# Patient Record
Sex: Female | Born: 1990 | Race: White | Hispanic: No | Marital: Married | Smoking: Never smoker
Health system: Southern US, Community
[De-identification: ages and names within clinical notes are randomized; demographics above are authoritative.]

## PROBLEM LIST (undated history)

## (undated) DIAGNOSIS — F419 Anxiety disorder, unspecified: Secondary | ICD-10-CM

## (undated) DIAGNOSIS — F32A Depression, unspecified: Secondary | ICD-10-CM

## (undated) DIAGNOSIS — O99019 Anemia complicating pregnancy, unspecified trimester: Secondary | ICD-10-CM

## (undated) HISTORY — DX: Anemia complicating pregnancy, unspecified trimester: O99.019

## (undated) HISTORY — DX: Depression, unspecified: F32.A

## (undated) HISTORY — DX: Anxiety disorder, unspecified: F41.9

---
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
## 2012-04-01 HISTORY — PX: FOOT SURGERY: SHX648

## 2013-04-01 HISTORY — PX: BREAST MASS EXCISION: SHX1267

## 2018-09-03 LAB — HM PAP SMEAR: HM Pap smear: NEGATIVE

## 2020-04-01 NOTE — L&D Delivery Note (Signed)
       Delivery Note   Deborah Lester is a 30 y.o. G2P1001 at [redacted]w[redacted]d Estimated Date of Delivery: 02/03/21  PRE-OPERATIVE DIAGNOSIS:  1) [redacted]w[redacted]d pregnancy.  2) SROM - Labor  POST-OPERATIVE DIAGNOSIS:  1) [redacted]w[redacted]d pregnancy s/p Vaginal, Spontaneous  2) Viable female  Delivery Type: Vaginal, Spontaneous    Delivery Anesthesia: Epidural   Labor Complications:      ESTIMATED BLOOD LOSS: 25  ml    FINDINGS:   1) female infant, Apgar scores of 9   at 1 minute and 9   at 5 minutes and a birthweight of   ounces.    2) Nuchal cord: Yes  SPECIMENS:   PLACENTA:   Appearance: Intact    Removal: Spontaneous      Disposition:    DISPOSITION:  Infant to left in stable condition in the delivery room, with L&D personnel and mother,  NARRATIVE SUMMARY: Labor course:  [name] is a G2P1001 at [redacted]w[redacted]d who presented for labor management.  She progressed well in labor without pitocin.  She received the appropriate anesthesia and proceeded to complete dilation. She evidenced good maternal expulsive effort during the second stage. She went on to deliver a viable infant. The placenta delivered without problems and was noted to be complete. A perineal and vaginal examination was performed. Episiotomy/Lacerations: None    Elonda Husky, M.D. 01/21/2021 11:34 AM

## 2020-06-06 ENCOUNTER — Ambulatory Visit (INDEPENDENT_AMBULATORY_CARE_PROVIDER_SITE_OTHER): Payer: BC Managed Care – PPO | Admitting: Certified Nurse Midwife

## 2020-06-06 ENCOUNTER — Encounter: Payer: Self-pay | Admitting: Certified Nurse Midwife

## 2020-06-06 ENCOUNTER — Other Ambulatory Visit: Payer: Self-pay

## 2020-06-06 VITALS — BP 116/76 | HR 69 | Resp 16 | Ht 67.0 in | Wt 136.0 lb

## 2020-06-06 DIAGNOSIS — N912 Amenorrhea, unspecified: Secondary | ICD-10-CM

## 2020-06-06 LAB — POCT URINE PREGNANCY: Preg Test, Ur: POSITIVE — AB

## 2020-06-06 MED ORDER — DOXYLAMINE-PYRIDOXINE 10-10 MG PO TBEC: 1.0000 | DELAYED_RELEASE_TABLET | Freq: Four times a day (QID) | ORAL | 5 refills | Status: DC

## 2020-06-06 NOTE — Progress Notes (Signed)
Subjective:    Shenaya Lebo is a 30 y.o. female who presents for evaluation of amenorrhea. She believes she could be pregnant. Pregnancy is desired. Sexual Activity: single partner, contraception: none. Current symptoms also include: fatigue and nausea. Last period was abnormal.   No LMP recorded. The following portions of the patient's history were reviewed and updated as appropriate: allergies, current medications, past family history, past medical history, past social history, past surgical history and problem list.  Review of Systems Pertinent items are noted in HPI.     Objective:    There were no vitals taken for this visit. General: alert, cooperative, appears stated age and no acute distress    Lab Review Urine HCG: positive    Assessment:    Absence of menstruation.     Plan:     Positive: EDC: unsure of last LMP ( dad passed in January, she can not remember if she had a period in Jan).. Briefly discussed pre-natal care options. MD or Midwifery care discussed. Encouraged well-balanced diet, plenty of rest when needed, pre-natal vitamins daily and walking for exercise. Discussed self-help for nausea, avoiding OTC medications until consulting provider or pharmacist, other than Tylenol as needed, minimal caffeine (1-2 cups daily) and avoiding alcohol. She will schedule her u/s for dating in 1-2 wks, her nurse visit @ [redacted] wks pregnant and her  initial OB visit @ 12 wks. Having nausea diclegis ordered. . Feel free to call with any questions.    Doreene Burke, CNM

## 2020-06-06 NOTE — Patient Instructions (Signed)
https://www.acog.org/womens-health/faqs/prenatal-genetic-screening-tests">  Prenatal Care Prenatal care is health care during pregnancy. It helps you and your unborn baby (fetus) stay as healthy as possible. Prenatal care may be provided by a midwife, a family practice doctor, a mid-level practitioner (nurse practitioner or physician assistant), or a childbirth and pregnancy doctor (obstetrician). How does this affect me? During pregnancy, you will be closely monitored for any new conditions that might develop. To lower your risk of pregnancy complications, you and your health care provider will talk about any underlying conditions you have. How does this affect my baby? Early and consistent prenatal care increases the chance that your baby will be healthy during pregnancy. Prenatal care lowers the risk that your baby will be:  Born early (prematurely).  Smaller than expected at birth (small for gestational age). What can I expect at the first prenatal care visit? Your first prenatal care visit will likely be the longest. You should schedule your first prenatal care visit as soon as you know that you are pregnant. Your first visit is a good time to talk about any questions or concerns you have about pregnancy. Medical history At your visit, you and your health care provider will talk about your medical history, including:  Any past pregnancies.  Your family's medical history.  Medical history of the baby's father.  Any long-term (chronic) health conditions you have and how you manage them.  Any surgeries or procedures you have had.  Any current over-the-counter or prescription medicines, herbs, or supplements that you are taking.  Other factors that could pose a risk to your baby, including: ? Exposure to harmful chemicals or radiation at work or at home. ? Any substance use, including tobacco, alcohol, and drug use.  Your home setting and your stress levels, including: ? Exposure to  abuse or violence. ? Household financial strain.  Your daily health habits, including diet and exercise. Tests and screenings Your health care provider will:  Measure your weight, height, and blood pressure.  Do a physical exam, including a pelvic and breast exam.  Perform blood tests and urine tests to check for: ? Urinary tract infection. ? Sexually transmitted infections (STIs). ? Low iron levels in your blood (anemia). ? Blood type and certain proteins on red blood cells (Rh antibodies). ? Infections and immunity to viruses, such as hepatitis B and rubella. ? HIV (human immunodeficiency virus).  Discuss your options for genetic screening. Tips about staying healthy Your health care provider will also give you information about how to keep yourself and your baby healthy, including:  Nutrition and taking vitamins.  Physical activity.  How to manage pregnancy symptoms such as nausea and vomiting (morning sickness).  Infections and substances that may be harmful to your baby and how to avoid them.  Food safety.  Dental care.  Working.  Travel.  Warning signs to watch for and when to call your health care provider. How often will I have prenatal care visits? After your first prenatal care visit, you will have regular visits throughout your pregnancy. The visit schedule is often as follows:  Up to week 28 of pregnancy: once every 4 weeks.  28-36 weeks: once every 2 weeks.  After 36 weeks: every week until delivery. Some women may have visits more or less often depending on any underlying health conditions and the health of the baby. Keep all follow-up and prenatal care visits. This is important. What happens during routine prenatal care visits? Your health care provider will:  Measure your weight   and blood pressure.  Check for fetal heart sounds.  Measure the height of your uterus in your abdomen (fundal height). This may be measured starting around week 20 of  pregnancy.  Check the position of your baby inside your uterus.  Ask questions about your diet, sleeping patterns, and whether you can feel the baby move.  Review warning signs to watch for and signs of labor.  Ask about any pregnancy symptoms you are having and how you are dealing with them. Symptoms may include: ? Headaches. ? Nausea and vomiting. ? Vaginal discharge. ? Swelling. ? Fatigue. ? Constipation. ? Changes in your vision. ? Feeling persistently sad or anxious. ? Any discomfort, including back or pelvic pain. ? Bleeding or spotting. Make a list of questions to ask your health care provider at your routine visits.   What tests might I have during prenatal care visits? You may have blood, urine, and imaging tests throughout your pregnancy, such as:  Urine tests to check for glucose, protein, or signs of infection.  Glucose tests to check for a form of diabetes that can develop during pregnancy (gestational diabetes mellitus). This is usually done around week 24 of pregnancy.  Ultrasounds to check your baby's growth and development, to check for birth defects, and to check your baby's well-being. These can also help to decide when you should deliver your baby.  A test to check for group B strep (GBS) infection. This is usually done around week 36 of pregnancy.  Genetic testing. This may include blood, fluid, or tissue sampling, or imaging tests, such as an ultrasound. Some genetic tests are done during the first trimester and some are done during the second trimester. What else can I expect during prenatal care visits? Your health care provider may recommend getting certain vaccines during pregnancy. These may include:  A yearly flu shot (annual influenza vaccine). This is especially important if you will be pregnant during flu season.  Tdap (tetanus, diphtheria, pertussis) vaccine. Getting this vaccine during pregnancy can protect your baby from whooping cough  (pertussis) after birth. This vaccine may be recommended between weeks 27 and 36 of pregnancy.  A COVID-19 vaccine. Later in your pregnancy, your health care provider may give you information about:  Childbirth and breastfeeding classes.  Choosing a health care provider for your baby.  Umbilical cord banking.  Breastfeeding.  Birth control after your baby is born.  The hospital labor and delivery unit and how to set up a tour.  Registering at the hospital before you go into labor. Where to find more information  Office on Women's Health: womenshealth.gov  American Pregnancy Association: americanpregnancy.org  March of Dimes: marchofdimes.org Summary  Prenatal care helps you and your baby stay as healthy as possible during pregnancy.  Your first prenatal care visit will most likely be the longest.  You will have visits and tests throughout your pregnancy to monitor your health and your baby's health.  Bring a list of questions to your visits to ask your health care provider.  Make sure to keep all follow-up and prenatal care visits. This information is not intended to replace advice given to you by your health care provider. Make sure you discuss any questions you have with your health care provider. Document Revised: 12/30/2019 Document Reviewed: 12/30/2019 Elsevier Patient Education  2021 Elsevier Inc.  

## 2020-06-19 ENCOUNTER — Other Ambulatory Visit: Payer: Self-pay | Admitting: Certified Nurse Midwife

## 2020-06-19 ENCOUNTER — Ambulatory Visit
Admission: RE | Admit: 2020-06-19 | Discharge: 2020-06-19 | Disposition: A | Discharge status: Home / Self Care | Payer: BC Managed Care – PPO | Source: Ambulatory Visit | Attending: Certified Nurse Midwife | Admitting: Certified Nurse Midwife

## 2020-06-19 ENCOUNTER — Other Ambulatory Visit: Payer: Self-pay

## 2020-06-19 DIAGNOSIS — N912 Amenorrhea, unspecified: Secondary | ICD-10-CM

## 2020-06-19 DIAGNOSIS — Z3491 Encounter for supervision of normal pregnancy, unspecified, first trimester: Secondary | ICD-10-CM

## 2020-07-07 ENCOUNTER — Other Ambulatory Visit: Payer: Self-pay

## 2020-07-07 ENCOUNTER — Ambulatory Visit (INDEPENDENT_AMBULATORY_CARE_PROVIDER_SITE_OTHER): Payer: BC Managed Care – PPO | Admitting: Surgical

## 2020-07-07 VITALS — BP 109/72 | HR 67 | Ht 67.0 in | Wt 132.4 lb

## 2020-07-07 DIAGNOSIS — Z0283 Encounter for blood-alcohol and blood-drug test: Secondary | ICD-10-CM | POA: Diagnosis not present

## 2020-07-07 DIAGNOSIS — Z3491 Encounter for supervision of normal pregnancy, unspecified, first trimester: Secondary | ICD-10-CM | POA: Diagnosis not present

## 2020-07-07 NOTE — Progress Notes (Signed)
Deborah Lester presents for NOB nurse interview visit. Pregnancy confirmation done 06/06/2020. G2 P1001. Pregnancy education material explained and given. 0 cats in home. NOB labs ordered.  HIV labs and drug screen were explained and ordered. PNV encouraged. Genetic screening options discussed. Genetic testing: Declined. Patient may discuss with the provider. Patient to follow up with provider on 07/19/2020 for NOB physical. All questions answered. Drug screen and FMLA form signed.

## 2020-07-07 NOTE — Patient Instructions (Signed)
[email or web address]">  First Trimester of Pregnancy  The first trimester of pregnancy starts on the first day of your last menstrual period until the end of week 12. This is also called months 1 through 3 of pregnancy. Body changes during your first trimester Your body goes through many changes during pregnancy. The changes usually return to normal after your baby is born. Physical changes  You may gain or lose weight.  Your breasts may grow larger and hurt. The area around your nipples may get darker.  Dark spots or blotches may develop on your face.  You may have changes in your hair. Health changes  You may feel like you might vomit (nauseous), and you may vomit.  You may have heartburn.  You may have headaches.  You may have trouble pooping (constipation).  Your gums may bleed. Other changes  You may get tired easily.  You may pee (urinate) more often.  Your menstrual periods will stop.  You may not feel hungry.  You may want to eat certain kinds of food.  You may have changes in your emotions from day to day.  You may have more dreams. Follow these instructions at home: Medicines  Take over-the-counter and prescription medicines only as told by your doctor. Some medicines are not safe during pregnancy.  Take a prenatal vitamin that contains at least 600 micrograms (mcg) of folic acid. Eating and drinking  Eat healthy meals that include: ? Fresh fruits and vegetables. ? Whole grains. ? Good sources of protein, such as meat, eggs, or tofu. ? Low-fat dairy products.  Avoid raw meat and unpasteurized juice, milk, and cheese.  If you feel like you may vomit, or you vomit: ? Eat 4 or 5 small meals a day instead of 3 large meals. ? Try eating a few soda crackers. ? Drink liquids between meals instead of during meals.  You may need to take these actions to prevent or treat trouble pooping: ? Drink enough fluids to keep your pee  (urine) pale yellow. ? Eat foods that are high in fiber. These include beans, whole grains, and fresh fruits and vegetables. ? Limit foods that are high in fat and sugar. These include fried or sweet foods. Activity  Exercise only as told by your doctor. Most people can do their usual exercise routine during pregnancy.  Stop exercising if you have cramps or pain in your lower belly (abdomen) or low back.  Do not exercise if it is too hot or too humid, or if you are in a place of great height (high altitude).  Avoid heavy lifting.  If you choose to, you may have sex unless your doctor tells you not to. Relieving pain and discomfort  Wear a good support bra if your breasts are sore.  Rest with your legs raised (elevated) if you have leg cramps or low back pain.  If you have bulging veins (varicose veins) in your legs: ? Wear support hose as told by your doctor. ? Raise your feet for 15 minutes, 3-4 times a day. ? Limit salt in your food. Safety  Wear your seat belt at all times when you are in a car.  Talk with your doctor if someone is hurting you or yelling at you.  Talk with your doctor if you are feeling sad or have thoughts of hurting yourself. Lifestyle  Do not use hot tubs, steam rooms, or saunas.  Do not douche. Do not use tampons or scented sanitary pads.  Do not  use herbal medicines, illegal drugs, or medicines that are not approved by your doctor. Do not drink alcohol.  Do not smoke or use any products that contain nicotine or tobacco. If you need help quitting, ask your doctor.  Avoid cat litter boxes and soil that is used by cats. These carry germs that can cause harm to the baby and can cause a loss of your baby by miscarriage or stillbirth. General instructions  Keep all follow-up visits. This is important.  Ask for help if you need counseling or if you need help with nutrition. Your doctor can give you advice or tell you where to go for help.  Visit your  dentist. At home, brush your teeth with a soft toothbrush. Floss gently.  Write down your questions. Take them to your prenatal visits. Where to find more information  American Pregnancy Association: americanpregnancy.org  American College of Obstetricians and Gynecologists: www.acog.org  Office on Women's Health: womenshealth.gov/pregnancy Contact a doctor if:  You are dizzy.  You have a fever.  You have mild cramps or pressure in your lower belly.  You have a nagging pain in your belly area.  You continue to feel like you may vomit, you vomit, or you have watery poop (diarrhea) for 24 hours or longer.  You have a bad-smelling fluid coming from your vagina.  You have pain when you pee.  You are exposed to a disease that spreads from person to person, such as chickenpox, measles, Zika virus, HIV, or hepatitis. Get help right away if:  You have spotting or bleeding from your vagina.  You have very bad belly cramping or pain.  You have shortness of breath or chest pain.  You have any kind of injury, such as from a fall or a car crash.  You have new or increased pain, swelling, or redness in an arm or leg. Summary  The first trimester of pregnancy starts on the first day of your last menstrual period until the end of week 12 (months 1 through 3).  Eat 4 or 5 small meals a day instead of 3 large meals.  Do not smoke or use any products that contain nicotine or tobacco. If you need help quitting, ask your doctor.  Keep all follow-up visits. This information is not intended to replace advice given to you by your health care provider. Make sure you discuss any questions you have with your health care provider. Document Revised: 08/25/2019 Document Reviewed: 07/01/2019 Elsevier Patient Education  2021 Elsevier Inc.    Common Medications Safe in Pregnancy  Acne:      Constipation:  Benzoyl Peroxide     Colace  Clindamycin      Dulcolax Suppository  Topica  Erythromycin     Fibercon  Salicylic Acid      Metamucil         Miralax AVOID:        Senakot   Accutane    Cough:  Retin-A       Cough Drops  Tetracycline      Phenergan w/ Codeine if Rx  Minocycline      Robitussin (Plain & DM)  Antibiotics:     Crabs/Lice:  Ceclor       RID  Cephalosporins    AVOID:  E-Mycins      Kwell  Keflex  Macrobid/Macrodantin   Diarrhea:  Penicillin      Kao-Pectate  Zithromax      Imodium AD           PUSH FLUIDS AVOID:       Cipro     Fever:  Tetracycline      Tylenol (Regular or Extra  Minocycline       Strength)  Levaquin      Extra Strength-Do not          Exceed 8 tabs/24 hrs Caffeine:        <200mg/day (equiv. To 1 cup of coffee or  approx. 3 12 oz sodas)         Gas: Cold/Hayfever:       Gas-X  Benadryl      Mylicon  Claritin       Phazyme  **Claritin-D        Chlor-Trimeton    Headaches:  Dimetapp      ASA-Free Excedrin  Drixoral-Non-Drowsy     Cold Compress  Mucinex (Guaifenasin)     Tylenol (Regular or Extra  Sudafed/Sudafed-12 Hour     Strength)  **Sudafed PE Pseudoephedrine   Tylenol Cold & Sinus     Vicks Vapor Rub  Zyrtec  **AVOID if Problems With Blood Pressure         Heartburn: Avoid lying down for at least 1 hour after meals  Aciphex      Maalox     Rash:  Milk of Magnesia     Benadryl    Mylanta       1% Hydrocortisone Cream  Pepcid  Pepcid Complete   Sleep Aids:  Prevacid      Ambien   Prilosec       Benadryl  Rolaids       Chamomile Tea  Tums (Limit 4/day)     Unisom         Tylenol PM         Warm milk-add vanilla or  Hemorrhoids:       Sugar for taste  Anusol/Anusol H.C.  (RX: Analapram 2.5%)  Sugar Substitutes:  Hydrocortisone OTC     Ok in moderation  Preparation H      Tucks        Vaseline lotion applied to tissue with wiping    Herpes:     Throat:  Acyclovir      Oragel  Famvir  Valtrex     Vaccines:         Flu Shot Leg Cramps:       *Gardasil  Benadryl      Hepatitis  A         Hepatitis B Nasal Spray:       Pneumovax  Saline Nasal Spray     Polio Booster         Tetanus Nausea:       Tuberculosis test or PPD  Vitamin B6 25 mg TID   AVOID:    Dramamine      *Gardasil  Emetrol       Live Poliovirus  Ginger Root 250 mg QID    MMR (measles, mumps &  High Complex Carbs @ Bedtime    rebella)  Sea Bands-Accupressure    Varicella (Chickenpox)  Unisom 1/2 tab TID     *No known complications           If received before Pain:         Known pregnancy;   Darvocet       Resume series after  Lortab        Delivery  Percocet    Yeast:   Tramadol        Femstat  Tylenol 3      Gyne-lotrimin  Ultram       Monistat  Vicodin           MISC:         All Sunscreens           Hair Coloring/highlights          Insect Repellant's          (Including DEET)         Mystic Tans  

## 2020-07-08 LAB — URINALYSIS, ROUTINE W REFLEX MICROSCOPIC
Bilirubin, UA: NEGATIVE
Glucose, UA: NEGATIVE
Leukocytes,UA: NEGATIVE
Nitrite, UA: NEGATIVE
Protein,UA: NEGATIVE
RBC, UA: NEGATIVE
Specific Gravity, UA: 1.023 (ref 1.005–1.030)
Urobilinogen, Ur: 0.2 mg/dL (ref 0.2–1.0)
pH, UA: 5.5 (ref 5.0–7.5)

## 2020-07-08 LAB — VIRAL HEPATITIS HBV, HCV
HCV Ab: <0.1 s/co ratio (ref 0.0–0.9)
Hep B Core Total Ab: NEGATIVE
Hep B Surface Ab, Qual: NONREACTIVE
Hepatitis B Surface Ag: NEGATIVE

## 2020-07-08 LAB — ABO AND RH: Rh Factor: POSITIVE

## 2020-07-08 LAB — HCV INTERPRETATION

## 2020-07-08 LAB — HIV ANTIBODY (ROUTINE TESTING W REFLEX): HIV Screen 4th Generation wRfx: NONREACTIVE

## 2020-07-08 LAB — ANTIBODY SCREEN: Antibody Screen: NEGATIVE

## 2020-07-08 LAB — RUBELLA SCREEN: Rubella Antibodies, IGG: 2.48 index (ref >0.99)

## 2020-07-08 LAB — VARICELLA ZOSTER ANTIBODY, IGG: Varicella zoster IgG: 655 index (ref >165)

## 2020-07-08 LAB — RPR: RPR Ser Ql: NONREACTIVE

## 2020-07-09 LAB — CULTURE, OB URINE

## 2020-07-09 LAB — URINE CULTURE, OB REFLEX

## 2020-07-09 LAB — GC/CHLAMYDIA PROBE AMP
Chlamydia trachomatis, NAA: NEGATIVE
Neisseria Gonorrhoeae by PCR: NEGATIVE

## 2020-07-10 LAB — MONITOR DRUG PROFILE 14(MW)
Amphetamine Scrn, Ur: NEGATIVE ng/mL
BARBITURATE SCREEN URINE: NEGATIVE ng/mL
BENZODIAZEPINE SCREEN, URINE: NEGATIVE ng/mL
Buprenorphine, Urine: NEGATIVE ng/mL
CANNABINOIDS UR QL SCN: NEGATIVE ng/mL
Cocaine (Metab) Scrn, Ur: NEGATIVE ng/mL
Creatinine(Crt), U: 156.6 mg/dL (ref 20.0–300.0)
Fentanyl, Urine: NEGATIVE pg/mL
Meperidine Screen, Urine: NEGATIVE ng/mL
Methadone Screen, Urine: NEGATIVE ng/mL
OXYCODONE+OXYMORPHONE UR QL SCN: NEGATIVE ng/mL
Opiate Scrn, Ur: NEGATIVE ng/mL
Ph of Urine: 5.3 (ref 4.5–8.9)
Phencyclidine Qn, Ur: NEGATIVE ng/mL
Propoxyphene Scrn, Ur: NEGATIVE ng/mL
SPECIFIC GRAVITY: 1.029
Tramadol Screen, Urine: NEGATIVE ng/mL

## 2020-07-19 ENCOUNTER — Encounter: Payer: Self-pay | Admitting: Certified Nurse Midwife

## 2020-07-19 ENCOUNTER — Ambulatory Visit: Payer: BC Managed Care – PPO | Admitting: Certified Nurse Midwife

## 2020-07-19 ENCOUNTER — Other Ambulatory Visit: Payer: Self-pay

## 2020-07-19 VITALS — BP 115/74 | HR 81 | Wt 132.2 lb

## 2020-07-19 DIAGNOSIS — Z3491 Encounter for supervision of normal pregnancy, unspecified, first trimester: Secondary | ICD-10-CM

## 2020-07-19 DIAGNOSIS — Z3A12 12 weeks gestation of pregnancy: Secondary | ICD-10-CM

## 2020-07-19 LAB — POCT URINALYSIS DIPSTICK OB
Bilirubin, UA: NEGATIVE
Blood, UA: NEGATIVE
Glucose, UA: NEGATIVE
Ketones, UA: NEGATIVE
Leukocytes, UA: NEGATIVE
Nitrite, UA: NEGATIVE
Spec Grav, UA: 1.025 (ref 1.010–1.025)
Urobilinogen, UA: 0.2 E.U./dL
pH, UA: 6 (ref 5.0–8.0)

## 2020-07-19 NOTE — Patient Instructions (Signed)
https://www.acog.org/womens-health/faqs/prenatal-genetic-screening-tests">  Prenatal Care Prenatal care is health care during pregnancy. It helps you and your unborn baby (fetus) stay as healthy as possible. Prenatal care may be provided by a midwife, a family practice doctor, a mid-level practitioner (nurse practitioner or physician assistant), or a childbirth and pregnancy doctor (obstetrician). How does this affect me? During pregnancy, you will be closely monitored for any new conditions that might develop. To lower your risk of pregnancy complications, you and your health care provider will talk about any underlying conditions you have. How does this affect my baby? Early and consistent prenatal care increases the chance that your baby will be healthy during pregnancy. Prenatal care lowers the risk that your baby will be:  Born early (prematurely).  Smaller than expected at birth (small for gestational age). What can I expect at the first prenatal care visit? Your first prenatal care visit will likely be the longest. You should schedule your first prenatal care visit as soon as you know that you are pregnant. Your first visit is a good time to talk about any questions or concerns you have about pregnancy. Medical history At your visit, you and your health care provider will talk about your medical history, including:  Any past pregnancies.  Your family's medical history.  Medical history of the baby's father.  Any long-term (chronic) health conditions you have and how you manage them.  Any surgeries or procedures you have had.  Any current over-the-counter or prescription medicines, herbs, or supplements that you are taking.  Other factors that could pose a risk to your baby, including: ? Exposure to harmful chemicals or radiation at work or at home. ? Any substance use, including tobacco, alcohol, and drug use.  Your home setting and your stress levels, including: ? Exposure to  abuse or violence. ? Household financial strain.  Your daily health habits, including diet and exercise. Tests and screenings Your health care provider will:  Measure your weight, height, and blood pressure.  Do a physical exam, including a pelvic and breast exam.  Perform blood tests and urine tests to check for: ? Urinary tract infection. ? Sexually transmitted infections (STIs). ? Low iron levels in your blood (anemia). ? Blood type and certain proteins on red blood cells (Rh antibodies). ? Infections and immunity to viruses, such as hepatitis B and rubella. ? HIV (human immunodeficiency virus).  Discuss your options for genetic screening. Tips about staying healthy Your health care provider will also give you information about how to keep yourself and your baby healthy, including:  Nutrition and taking vitamins.  Physical activity.  How to manage pregnancy symptoms such as nausea and vomiting (morning sickness).  Infections and substances that may be harmful to your baby and how to avoid them.  Food safety.  Dental care.  Working.  Travel.  Warning signs to watch for and when to call your health care provider. How often will I have prenatal care visits? After your first prenatal care visit, you will have regular visits throughout your pregnancy. The visit schedule is often as follows:  Up to week 28 of pregnancy: once every 4 weeks.  28-36 weeks: once every 2 weeks.  After 36 weeks: every week until delivery. Some women may have visits more or less often depending on any underlying health conditions and the health of the baby. Keep all follow-up and prenatal care visits. This is important. What happens during routine prenatal care visits? Your health care provider will:  Measure your weight   and blood pressure.  Check for fetal heart sounds.  Measure the height of your uterus in your abdomen (fundal height). This may be measured starting around week 20 of  pregnancy.  Check the position of your baby inside your uterus.  Ask questions about your diet, sleeping patterns, and whether you can feel the baby move.  Review warning signs to watch for and signs of labor.  Ask about any pregnancy symptoms you are having and how you are dealing with them. Symptoms may include: ? Headaches. ? Nausea and vomiting. ? Vaginal discharge. ? Swelling. ? Fatigue. ? Constipation. ? Changes in your vision. ? Feeling persistently sad or anxious. ? Any discomfort, including back or pelvic pain. ? Bleeding or spotting. Make a list of questions to ask your health care provider at your routine visits.   What tests might I have during prenatal care visits? You may have blood, urine, and imaging tests throughout your pregnancy, such as:  Urine tests to check for glucose, protein, or signs of infection.  Glucose tests to check for a form of diabetes that can develop during pregnancy (gestational diabetes mellitus). This is usually done around week 24 of pregnancy.  Ultrasounds to check your baby's growth and development, to check for birth defects, and to check your baby's well-being. These can also help to decide when you should deliver your baby.  A test to check for group B strep (GBS) infection. This is usually done around week 36 of pregnancy.  Genetic testing. This may include blood, fluid, or tissue sampling, or imaging tests, such as an ultrasound. Some genetic tests are done during the first trimester and some are done during the second trimester. What else can I expect during prenatal care visits? Your health care provider may recommend getting certain vaccines during pregnancy. These may include:  A yearly flu shot (annual influenza vaccine). This is especially important if you will be pregnant during flu season.  Tdap (tetanus, diphtheria, pertussis) vaccine. Getting this vaccine during pregnancy can protect your baby from whooping cough  (pertussis) after birth. This vaccine may be recommended between weeks 27 and 36 of pregnancy.  A COVID-19 vaccine. Later in your pregnancy, your health care provider may give you information about:  Childbirth and breastfeeding classes.  Choosing a health care provider for your baby.  Umbilical cord banking.  Breastfeeding.  Birth control after your baby is born.  The hospital labor and delivery unit and how to set up a tour.  Registering at the hospital before you go into labor. Where to find more information  Office on Women's Health: womenshealth.gov  American Pregnancy Association: americanpregnancy.org  March of Dimes: marchofdimes.org Summary  Prenatal care helps you and your baby stay as healthy as possible during pregnancy.  Your first prenatal care visit will most likely be the longest.  You will have visits and tests throughout your pregnancy to monitor your health and your baby's health.  Bring a list of questions to your visits to ask your health care provider.  Make sure to keep all follow-up and prenatal care visits. This information is not intended to replace advice given to you by your health care provider. Make sure you discuss any questions you have with your health care provider. Document Revised: 12/30/2019 Document Reviewed: 12/30/2019 Elsevier Patient Education  2021 Elsevier Inc.  

## 2020-07-19 NOTE — Progress Notes (Signed)
ROB: She has been having some cramps, but feeling ok. No new concerns today.

## 2020-07-19 NOTE — Progress Notes (Signed)
NEW OB HISTORY AND PHYSICAL  SUBJECTIVE:       Deborah Lester is a 30 y.o. G62P1001 female, Patient's last menstrual period was 04/22/2020., Estimated Date of Delivery: 01/27/21, [redacted]w[redacted]d, presents today for establishment of Prenatal Care. She has no unusual complaints, she continues to have nausea that the medication helps with.   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 Normal Contraception: none Last Pap: 09/03/2018. Results were: negative, negative HPV  Obstetric History OB History  Gravida Para Term Preterm AB Living  2 1 1     1   SAB IAB Ectopic Multiple Live Births          1    # Outcome Date GA Lbr Len/2nd Weight Sex Delivery Anes PTL Lv  2 Current           1 Term 05/08/17   6 lb 11 oz (3.034 kg) F Vag-Spont  N LIV    Past Medical History:  Diagnosis Date  . Anemia affecting pregnancy     Past Surgical History:  Procedure Laterality Date  . BREAST MASS EXCISION  2015  . FOOT SURGERY  2014  . WISDOM TOOTH EXTRACTION  2011    Current Outpatient Medications on File Prior to Visit  Medication Sig Dispense Refill  . Doxylamine-Pyridoxine 10-10 MG TBEC Take 1 tablet by mouth 4 (four) times daily. Day 1 &2: 2 tablet at bedtimeDay 3 : if symptoms persists 1 tablet am; 2 tablet at bedtimeDay 4: 1 tablet am, 1 tab afternoon, 2 tab at bedtime 120 tablet 5   No current facility-administered medications on file prior to visit.    Not on File  Social History   Socioeconomic History  . Marital status: Married    Spouse name: Not on file  . Number of children: Not on file  . Years of education: Not on file  . Highest education level: Not on file  Occupational History  . Not on file  Tobacco Use  . Smoking status: Never Smoker  . Smokeless tobacco: Never  Used  Substance and Sexual Activity  . Alcohol use: Not Currently  . Drug use: Not Currently  . Sexual activity: Yes    Partners: Male  Other Topics Concern  . Not on file  Social History Narrative  . Not on file   Social Determinants of Health   Financial Resource Strain: Not on file  Food Insecurity: Not on file  Transportation Needs: Not on file  Physical Activity: Not on file  Stress: Not on file  Social Connections: Not on file  Intimate Partner Violence: Not on file    Family History  Problem Relation Age of Onset  . Diabetes Father   . Hypertension Maternal Grandfather   . Diabetes Maternal Grandfather   . COPD Maternal Grandfather   . Diabetes Paternal Grandmother   . Hypertension Paternal Grandmother   . Diabetes Paternal Grandfather   . Hypertension Paternal Grandfather     The following portions of the patient's history were reviewed and updated as appropriate: allergies, current medications, past OB history, past medical history, past surgical history, past family history, past social history, and problem list.    OBJECTIVE: Initial Physical Exam (New OB)  GENERAL APPEARANCE: alert, well appearing, in no apparent distress, oriented to person, place  and time HEAD: normocephalic, atraumatic MOUTH: mucous membranes moist, pharynx normal without lesions THYROID: no thyromegaly or masses present BREASTS: no masses noted, no significant tenderness, no palpable axillary nodes, no skin changes LUNGS: clear to auscultation, no wheezes, rales or rhonchi, symmetric air entry HEART: regular rate and rhythm, no murmurs ABDOMEN: soft, nontender, nondistended, no abnormal masses, no epigastric pain and FHT present EXTREMITIES: no redness or tenderness in the calves or thighs, no edema, no limitation in range of motion, intact peripheral pulses SKIN: normal coloration and turgor, no rashes LYMPH NODES: no adenopathy palpable NEUROLOGIC: alert, oriented, normal speech,  no focal findings or movement disorder noted  PELVIC EXAM deferred, test pelvis   ASSESSMENT: Normal pregnancy  PLAN: Prenatal care See ordersNew OB counseling: The patient has been given an overview regarding routine prenatal care. Recommendations regarding diet, weight gain, and exercise in pregnancy were given. Prenatal testing, optional genetic testing, carrier screening, and ultrasound use in pregnancy were reviewed. Benefits of Breast Feeding were discussed. The patient is encouraged to consider nursing her baby post partum.  Doreene Burke, CNM

## 2020-07-20 LAB — CBC
Hematocrit: 38.2 % (ref 34.0–46.6)
Hemoglobin: 12.9 g/dL (ref 11.1–15.9)
MCH: 29.4 pg (ref 26.6–33.0)
MCHC: 33.8 g/dL (ref 31.5–35.7)
MCV: 87 fL (ref 79–97)
Platelets: 340 10*3/uL (ref 150–450)
RBC: 4.39 x10E6/uL (ref 3.77–5.28)
RDW: 12.8 % (ref 11.7–15.4)
WBC: 11.4 10*3/uL — ABNORMAL HIGH (ref 3.4–10.8)

## 2020-08-14 ENCOUNTER — Other Ambulatory Visit: Payer: Self-pay

## 2020-08-14 ENCOUNTER — Encounter: Payer: Self-pay | Admitting: Certified Nurse Midwife

## 2020-08-14 ENCOUNTER — Ambulatory Visit (INDEPENDENT_AMBULATORY_CARE_PROVIDER_SITE_OTHER): Payer: BC Managed Care – PPO | Admitting: Certified Nurse Midwife

## 2020-08-14 VITALS — BP 94/61 | HR 76 | Wt 133.6 lb

## 2020-08-14 DIAGNOSIS — F32A Depression, unspecified: Secondary | ICD-10-CM

## 2020-08-14 DIAGNOSIS — O219 Vomiting of pregnancy, unspecified: Secondary | ICD-10-CM

## 2020-08-14 DIAGNOSIS — Z3482 Encounter for supervision of other normal pregnancy, second trimester: Secondary | ICD-10-CM

## 2020-08-14 DIAGNOSIS — Z3A15 15 weeks gestation of pregnancy: Secondary | ICD-10-CM | POA: Diagnosis not present

## 2020-08-14 DIAGNOSIS — R109 Unspecified abdominal pain: Secondary | ICD-10-CM

## 2020-08-14 DIAGNOSIS — R519 Headache, unspecified: Secondary | ICD-10-CM | POA: Insufficient documentation

## 2020-08-14 DIAGNOSIS — Z8659 Personal history of other mental and behavioral disorders: Secondary | ICD-10-CM

## 2020-08-14 DIAGNOSIS — O26899 Other specified pregnancy related conditions, unspecified trimester: Secondary | ICD-10-CM

## 2020-08-14 DIAGNOSIS — Z3689 Encounter for other specified antenatal screening: Secondary | ICD-10-CM

## 2020-08-14 DIAGNOSIS — O99342 Other mental disorders complicating pregnancy, second trimester: Secondary | ICD-10-CM

## 2020-08-14 DIAGNOSIS — O26892 Other specified pregnancy related conditions, second trimester: Secondary | ICD-10-CM

## 2020-08-14 LAB — POCT URINALYSIS DIPSTICK OB
Bilirubin, UA: NEGATIVE
Blood, UA: NEGATIVE
Glucose, UA: NEGATIVE
Ketones, UA: NEGATIVE
Nitrite, UA: NEGATIVE
POC,PROTEIN,UA: NEGATIVE
Spec Grav, UA: 1.02 (ref 1.010–1.025)
Urobilinogen, UA: 0.2 E.U./dL
pH, UA: 6 (ref 5.0–8.0)

## 2020-08-14 MED ORDER — SERTRALINE HCL 25 MG PO TABS: 25.0000 mg | ORAL_TABLET | Freq: Every day | ORAL | 1 refills | Status: DC

## 2020-08-14 NOTE — Progress Notes (Signed)
ROB- Reports cramping, round ligament pain, and migraine over the past week without relief from tylenol. Discussed home treatment measures including increased water intake. Notes inmproved nausea and vomiting with decreased appetite. Willing to try protein shakes, will message CNM with ingredients of beachbody shakes. Patient has hx of anxiety/depression Phq9/GAD score: 8/6. Desires medication at this time. Rx for 25mg  of Zoloft. Anticipatory guidance regarding course of prenatal care. Reviewed red flag symptoms and when to call.  Schedule ANATOMY Scan for 4 weeks; RTC x 4 weeks for ROB with ANNIE or sooner if needed.  , Student-Mid[employer]ife [employer] 08/14/20 2:08 PM      Depression screen [facility] 2/9 08/14/2020 07/19/2020  Decreased Interest 0 0  Down, Depressed, Hopeless 1 0  PHQ - 2 Score 1 0  Altered sleeping 0 -  Tired, decreased energy 3 -  Change in appetite 2 -  Feeling bad or failure about yourself  0 -  Trouble concentrating 1 -  Moving slowly or fidgety/restless 1 -  Suicidal thoughts 0 -  PHQ-9 Score 8 -  Difficult doing work/chores Somewhat difficult -   GAD 7 : Generalized Anxiety Score 08/14/2020  Nervous, Anxious, on Edge 2  Control/stop worrying 1  Worry too much - different things 1  Trouble relaxing 1  Restless 0  Easily annoyed or irritable 1  Afraid - awful might happen 0  Total GAD 7 Score 6  Anxiety Difficulty Somewhat difficult

## 2020-08-14 NOTE — Progress Notes (Signed)
ROB: She has been having some mild cramping. She also has been having migraines x 1 week. She has been treating migraines with Tylenol with no very little relief.

## 2020-08-14 NOTE — Patient Instructions (Addendum)
Round Ligament Pain  The round ligament is a cord of muscle and tissue that helps support the uterus. It can become a source of pain during pregnancy if it becomes stretched or twisted as the baby grows. The pain usually begins in the second trimester (13-28 weeks) of pregnancy, and it can come and go until the baby is delivered. It is not a serious problem, and it does not cause harm to the baby. Round ligament pain is usually a short, sharp, and pinching pain, but it can also be a dull, lingering, and aching pain. The pain is felt in the lower side of the abdomen or in the groin. It usually starts deep in the groin and moves up to the outside of the hip area. The pain may occur when you:  Suddenly change position, such as quickly going from a sitting to standing position.  Roll over in bed.  Cough or sneeze.  Do physical activity. Follow these instructions at home:  Watch your condition for any changes.  When the pain starts, relax. Then try any of these methods to help with the pain: ? Sitting down. ? Flexing your knees up to your abdomen. ? Lying on your side with one pillow under your abdomen and another pillow between your legs. ? Sitting in a warm bath for 15-20 minutes or until the pain goes away.  Take over-the-counter and prescription medicines only as told by your health care provider.  Move slowly when you sit down or stand up.  Avoid long walks if they cause pain.  Stop or reduce your physical activities if they cause pain.  Keep all follow-up visits as told by your health care provider. This is important.   Contact a health care provider if:  Your pain does not go away with treatment.  You feel pain in your back that you did not have before.  Your medicine is not helping. Get help right away if:  You have a fever or chills.  You develop uterine contractions.  You have vaginal bleeding.  You have nausea or vomiting.  You have diarrhea.  You have pain  when you urinate. Summary  Round ligament pain is felt in the lower abdomen or groin. It is usually a short, sharp, and pinching pain. It can also be a dull, lingering, and aching pain.  This pain usually begins in the second trimester (13-28 weeks). It occurs because the uterus is stretching with the growing baby, and it is not harmful to the baby.  You may notice the pain when you suddenly change position, when you cough or sneeze, or during physical activity.  Relaxing, flexing your knees to your abdomen, lying on one side, or taking a warm bath may help to get rid of the pain.  Get help from your health care provider if the pain does not go away or if you have vaginal bleeding, nausea, vomiting, diarrhea, or painful urination. This information is not intended to replace advice given to you by your health care provider. Make sure you discuss any questions you have with your health care provider. Document Revised: 09/03/2017 Document Reviewed: 09/03/2017 Elsevier Patient Education  [place].   Common Medications Safe in Pregnancy  Acne:      Constipation:  Benzoyl Peroxide     Colace  Clindamycin      Dulcolax Suppository  Topica Erythromycin     Fibercon  Salicylic Acid      Metamucil  Miralax AVOID:        Senakot   Accutane    Cough:  Retin-A       Cough Drops  Tetracycline      Phenergan w/ Codeine if Rx  Minocycline      Robitussin (Plain & DM)  Antibiotics:     Crabs/Lice:  Ceclor       RID  Cephalosporins    AVOID:  E-Mycins      Kwell  Keflex  Macrobid/Macrodantin   Diarrhea:  Penicillin      Kao-Pectate  Zithromax      Imodium AD         PUSH FLUIDS AVOID:       Cipro     Fever:  Tetracycline      Tylenol (Regular or Extra  Minocycline       Strength)  Levaquin      Extra Strength-Do not          Exceed 8 tabs/24 hrs Caffeine:        <259m/day (equiv. To 1 cup of coffee or  approx. 3 12 oz  sodas)         Gas: Cold/Hayfever:       Gas-X  Benadryl      Mylicon  Claritin       Phazyme  **Claritin-D        Chlor-Trimeton    Headaches:  Dimetapp      ASA-Free Excedrin  Drixoral-Non-Drowsy     Cold Compress  Mucinex (Guaifenasin)     Tylenol (Regular or Extra  Sudafed/Sudafed-12 Hour     Strength)  **Sudafed PE Pseudoephedrine   Tylenol Cold & Sinus     Vicks Vapor Rub  Zyrtec  **AVOID if Problems With Blood Pressure         Heartburn: Avoid lying down for at least 1 hour after meals  Aciphex      Maalox     Rash:  Milk of Magnesia     Benadryl    Mylanta       1% Hydrocortisone Cream  Pepcid  Pepcid Complete   Sleep Aids:  Prevacid      Ambien   Prilosec       Benadryl  Rolaids       Chamomile Tea  Tums (Limit 4/day)     Unisom         Tylenol PM         Warm milk-add vanilla or  Hemorrhoids:       Sugar for taste  Anusol/Anusol H.C.  (RX: Analapram 2.5%)  Sugar Substitutes:  Hydrocortisone OTC     Ok in moderation  Preparation H      Tucks        Vaseline lotion applied to tissue with wiping    Herpes:     Throat:  Acyclovir      Oragel  Famvir  Valtrex     Vaccines:         Flu Shot Leg Cramps:       *Gardasil  Benadryl      Hepatitis A         Hepatitis B Nasal Spray:       Pneumovax  Saline Nasal Spray     Polio Booster         Tetanus Nausea:       Tuberculosis test or PPD  Vitamin B6 25 mg TID   AVOID:    Dramamine      *  Gardasil  Emetrol       Live Poliovirus  Ginger Root 250 mg QID    MMR (measles, mumps &  High Complex Carbs @ Bedtime    rebella)  Sea Bands-Accupressure    Varicella (Chickenpox)  Unisom 1/2 tab TID     *No known complications           If received before Pain:         Known pregnancy;   Darvocet       Resume series after  Lortab        Delivery  Percocet    Yeast:   Tramadol      Femstat  Tylenol 3      Gyne-lotrimin  Ultram       Monistat  Vicodin           MISC:         All Sunscreens           Hair  Coloring/highlights          Insect Repellant's          (Including DEET)         Mystic Tans   Second Trimester of Pregnancy  The second trimester of pregnancy is from week 13 through week 27. This is also called months 4 through 6 of pregnancy. This is often the time when you feel your best. During the second trimester:  Morning sickness is less or has stopped.  You may have more energy.  You may feel hungry more often. At this time, your unborn baby (fetus) is growing very fast. At the end of the sixth month, the unborn baby may be up to 12 inches long and weigh about 1 pounds. You will likely start to feel the baby move between 16 and 20 weeks of pregnancy. Body changes during your second trimester Your body continues to go through many changes during this time. The changes vary and generally return to normal after the baby is born. Physical changes  You will gain more weight.  You may start to get stretch marks on your hips, belly (abdomen), and breasts.  Your breasts will grow and may hurt.  Dark spots or blotches may develop on your face.  A dark line from your belly button to the pubic area (linea nigra) may appear.  You may have changes in your hair. Health changes  You may have headaches.  You may have heartburn.  You may have trouble pooping (constipation).  You may have hemorrhoids or swollen, bulging veins (varicose veins).  Your gums may bleed.  You may pee (urinate) more often.  You may have back pain. Follow these instructions at home: Medicines  Take over-the-counter and prescription medicines only as told by your doctor. Some medicines are not safe during pregnancy.  Take a prenatal vitamin that contains at least 600 micrograms (mcg) of folic acid. Eating and drinking  Eat healthy meals that include: ? Fresh fruits and vegetables. ? Whole grains. ? Good sources of protein, such as meat, eggs, or tofu. ? Low-fat dairy products.  Avoid raw  meat and unpasteurized juice, milk, and cheese.  You may need to take these actions to prevent or treat trouble pooping: ? Drink enough fluids to keep your pee (urine) pale yellow. ? Eat foods that are high in fiber. These include beans, whole grains, and fresh fruits and vegetables. ? Limit foods that are high in fat and sugar. These include fried or sweet foods. Activity  Exercise only as told by your doctor. Most people can do their usual exercise during pregnancy. Try to exercise for 30 minutes at least 5 days a week.  Stop exercising if you have pain or cramps in your belly or lower back.  Do not exercise if it is too hot or too humid, or if you are in a place of great height (high altitude).  Avoid heavy lifting.  If you choose to, you may have sex unless your doctor tells you not to. Relieving pain and discomfort  Wear a good support bra if your breasts are sore.  Take warm water baths (sitz baths) to soothe pain or discomfort caused by hemorrhoids. Use hemorrhoid cream if your doctor approves.  Rest with your legs raised (elevated) if you have leg cramps or low back pain.  If you develop bulging veins in your legs: ? Wear support hose as told by your doctor. ? Raise your feet for 15 minutes, 3-4 times a day. ? Limit salt in your food. Safety  Wear your seat belt at all times when you are in a car.  Talk with your doctor if someone is hurting you or yelling at you a lot. Lifestyle  Do not use hot tubs, steam rooms, or saunas.  Do not douche. Do not use tampons or scented sanitary pads.  Avoid cat litter boxes and soil used by cats. These carry germs that can harm your baby and can cause a loss of your baby by miscarriage or stillbirth.  Do not use herbal medicines, illegal drugs, or medicines that are not approved by your doctor. Do not drink alcohol.  Do not smoke or use any products that contain nicotine or tobacco. If you need help quitting, ask your  doctor. General instructions  Keep all follow-up visits. This is important.  Ask your doctor about local prenatal classes.  Ask your doctor about the right foods to eat or for help finding a counselor. Where to find more information  American Pregnancy Association: americanpregnancy.org  [employer]: www.acog.org  Office on [employer]: KeywordPortfolios.com.br Contact a doctor if:  You have a headache that does not go away when you take medicine.  You have changes in how you see, or you see spots in front of your eyes.  You have mild cramps, pressure, or pain in your lower belly.  You continue to feel like you may vomit (nauseous), you vomit, or you have watery poop (diarrhea).  You have bad-smelling fluid coming from your vagina.  You have pain when you pee or your pee smells bad.  You have very bad swelling of your face, hands, ankles, feet, or legs.  You have a fever. Get help right away if:  You are leaking fluid from your vagina.  You have spotting or bleeding from your vagina.  You have very bad belly cramping or pain.  You have trouble breathing.  You have chest pain.  You faint.  You have not felt your baby move for the time period told by your doctor.  You have new or increased pain, swelling, or redness in an arm or leg. Summary  The second trimester of pregnancy is from week 13 through week 27 (months 4 through 6).  Eat healthy meals.  Exercise as told by your doctor. Most people can do their usual exercise during pregnancy.  Do not use herbal medicines, illegal drugs, or medicines that are not approved by your doctor. Do not drink alcohol.  Call your doctor if you get sick or if you notice anything unusual about your pregnancy. This information is not intended to replace advice given to you by your health care provider. Make sure you discuss any questions you have with your health care  provider. Document Revised: 08/25/2019 Document Reviewed: 07/01/2019 Elsevier Patient Education  2021 [employer].

## 2020-08-14 NOTE — Progress Notes (Signed)
I have seen, interviewed, and examined the patient in conjunction with the [employer] and affirm the diagnosis and management plan.   Gunnar Bulla, CNM [facility], [facility] 08/14/20 4:08 PM

## 2020-08-31 ENCOUNTER — Other Ambulatory Visit: Payer: Self-pay | Admitting: Certified Nurse Midwife

## 2020-08-31 ENCOUNTER — Telehealth: Payer: Self-pay

## 2020-08-31 DIAGNOSIS — F32A Depression, unspecified: Secondary | ICD-10-CM

## 2020-08-31 DIAGNOSIS — F419 Anxiety disorder, unspecified: Secondary | ICD-10-CM

## 2020-08-31 DIAGNOSIS — Z3482 Encounter for supervision of other normal pregnancy, second trimester: Secondary | ICD-10-CM

## 2020-08-31 DIAGNOSIS — Z3A18 18 weeks gestation of pregnancy: Secondary | ICD-10-CM

## 2020-08-31 NOTE — Progress Notes (Signed)
Ultrasound order updated, see chart.    Serafina Royals, CNM [facility], [facility] 08/31/20 10:51 AM

## 2020-08-31 NOTE — Telephone Encounter (Signed)
Mar/LM for patient (to advise of appointment information) if patient calls back please advise-09/22/[email or web address] , no children allowed; may have one visitor 13+. Thanks.

## 2020-09-08 ENCOUNTER — Ambulatory Visit (HOSPITAL_COMMUNITY): Payer: BC Managed Care – PPO

## 2020-09-11 ENCOUNTER — Encounter: Payer: Self-pay | Admitting: Certified Nurse Midwife

## 2020-09-11 ENCOUNTER — Other Ambulatory Visit: Payer: Self-pay

## 2020-09-11 ENCOUNTER — Ambulatory Visit: Payer: BC Managed Care – PPO | Admitting: Certified Nurse Midwife

## 2020-09-11 VITALS — BP 112/70 | HR 76 | Wt 134.6 lb

## 2020-09-11 DIAGNOSIS — Z3402 Encounter for supervision of normal first pregnancy, second trimester: Secondary | ICD-10-CM

## 2020-09-11 DIAGNOSIS — Z3A20 20 weeks gestation of pregnancy: Secondary | ICD-10-CM

## 2020-09-11 LAB — POCT URINALYSIS DIPSTICK
Bilirubin, UA: NEGATIVE
Blood, UA: NEGATIVE
Glucose, UA: NEGATIVE
Ketones, UA: NEGATIVE
Leukocytes, UA: NEGATIVE
Nitrite, UA: NEGATIVE
Protein, UA: NEGATIVE
Spec Grav, UA: 1.015 (ref 1.010–1.025)
Urobilinogen, UA: 0.2 E.U./dL
pH, UA: 6.5 (ref 5.0–8.0)

## 2020-09-11 MED ORDER — VALACYCLOVIR HCL 1 G PO TABS: 1000.0000 mg | ORAL_TABLET | Freq: Two times a day (BID) | ORAL | 0 refills | Status: AC

## 2020-09-11 NOTE — Progress Notes (Signed)
ROB doing well, thinks she is feeling movement. IS not 100% certain that it is baby all the time. She is concerned that she has a shingles outbreak , has had in the past. Started a few days ago on her right side, is itching, no tenderness at this time. Orders placed for valtrex. She has u/s for anatomy scheduled. Will follow up with results. ROB in 4 wks.   Doreene Burke, CNM

## 2020-09-11 NOTE — Addendum Note (Signed)
Addended by: Mechele Claude on: 09/11/2020 02:18 PM   Modules accepted: Orders

## 2020-09-11 NOTE — Patient Instructions (Addendum)
Shingles  Shingles is an infection. It gives you a painful skin rash and blisters that have fluid in them. Shingles is caused by the same germ (virus) that causes chickenpox. Shingles only happens in people who: Have had chickenpox. Have been given a shot (vaccine) to protect against chickenpox. Shingles is rare in this group. What are the causes? This condition is caused by varicella-zoster virus. This is the same germ that causes chickenpox. After a person is exposed to the germ, the germ stays in the body but is not active (dormant). Shingles develops if the germ becomes active again (is reactivated). This can happen many years after the first exposure to the germ. It is notknown what causes this germ to become active again. What increases the risk? People who have had chickenpox or received the chickenpox shot are at risk for shingles. This infection is more common in people who: Are older than 30 years of age. Have a weakened disease-fighting system (immune system), such as people with: HIV (human immunodeficiency virus). AIDS (acquired immunodeficiency syndrome). Cancer. Are taking medicines that weaken the immune system, such as organ transplant medicines. Have a lot of stress. What are the signs or symptoms? The first symptoms of shingles may be itching, tingling, or pain in an area onyour skin. A rash will show on your skin a few days or weeks later. This is what usually happens: The rash is likely to be on one side of your body. The rash usually has a shape like a belt or a band. Over time, the rash turns into fluid-filled blisters. The blisters will break open and change into scabs. The scabs usually dry up in about 2-3 weeks. You may also have: A fever. Chills. A headache. A feeling like you may vomit (nausea). How is this treated? The rash may last for several weeks. There is not a specific cure for thiscondition. Your doctor may prescribe medicines. Medicines may: Help  with pain. Help you get better sooner. Help to prevent long-term problems. Help with itching (antihistamines). If the area involved is on your face, you may need to see a specialist. Thismay be an eye doctor or an ear, nose, and throat (ENT) doctor. Follow these instructions at home: Medicines Take over-the-counter and prescription medicines only as told by your doctor. Put on an anti-itch cream or numbing cream where you have a rash, blisters, or scabs. Do this as told by your doctor. Helping with itching and discomfort  Put cold, wet cloths (cold compresses) on the area of the rash or blisters as told by your doctor. Cool baths can help you feel better. Try adding baking soda or dry oatmeal to the water to lessen itching. Do not bathe in hot water. Use calamine lotion as told by your doctor.  Blister and rash care Keep your rash covered with a loose bandage (dressing). Wear loose clothing that does not rub on your rash. Wash your hands with soap and water for at least 20 seconds before and after you change your bandage. If you cannot use soap and water, use hand sanitizer. Change your bandage as told by your doctor. Keep your rash and blisters clean. To do this, wash the area with mild soap and cool water as told by your doctor. Check your rash every day for signs of infection. Check for: More redness, swelling, or pain. Fluid or blood. Warmth. Pus or a bad smell. Do not scratch your rash. Do not pick at your blisters. To help you to   not scratch: Keep your fingernails clean and cut short. Wear gloves or mittens when you sleep, if scratching is a problem. General instructions Rest as told by your doctor. Wash your hands often with soap and water for at least 20 seconds. If you cannot use soap and water, use hand sanitizer. Doing this lowers your chance of getting a skin infection. Your infection can cause chickenpox in people who have never had chickenpox or never got a chickenpox  vaccine shot. If you have blisters that did not change into scabs yet, try not to touch other people or be around other people, especially: Babies. Pregnant women. Children who have areas of red, itchy, or rough skin (eczema). Older people who have organ transplants. People who have a long-term (chronic) illness, like cancer or AIDS. Keep all follow-up visits. How is this prevented? A vaccine shot is the best way to prevent shingles and protect against shinglesproblems. If you have not had a vaccine shot, talk with your doctor about getting it. Where to find more information Centers for Disease Control and Prevention: FootballExhibition.com.br Contact a doctor if: Your pain does not get better with medicine. Your pain does not get better after the rash heals. You have any of these signs of infection around the rash: More redness, swelling, or pain. Fluid or blood. Warmth. Pus or a bad smell. You have a fever. Get help right away if: The rash is on your face or nose. You have pain in your face or pain by your eye. You lose feeling on one side of your face. You have trouble seeing. You have ear pain, or you have ringing in your ear. You have a loss of taste. Your condition gets worse. Summary Shingles gives you a painful skin rash and blisters that have fluid in them. Shingles is caused by the same germ (virus) that causes chickenpox. Keep your rash covered with a loose bandage. Wear loose clothing that does not rub on your rash. If you have blisters that did not change into scabs yet, try not to touch other people or be around people. This information is not intended to replace advice given to you by your health care provider. Make sure you discuss any questions you have with your healthcare provider. Document Revised: 03/13/2020 Document Reviewed: 03/13/2020 Elsevier Patient Education  2022 Elsevier Inc. Round Ligament Pain  The round ligament is a cord of muscle and tissue that helps  support the uterus. It can become a source of pain during pregnancy if it becomes stretched or twisted as the baby grows. The pain usually begins in the second trimester (13-28 weeks) of pregnancy, and it can come and go until the baby is delivered.It is not a serious problem, and it does not cause harm to the baby. Round ligament pain is usually a short, sharp, and pinching pain, but it can also be a dull, lingering, and aching pain. The pain is felt in the lower side of the abdomen or in the groin. It usually starts deep in the groin and moves up to the outside of the hip area. The pain may occur when you: Suddenly change position, such as quickly going from a sitting to standing position. Roll over in bed. Cough or sneeze. Do physical activity. Follow these instructions at home:  Watch your condition for any changes. When the pain starts, relax. Then try any of these methods to help with the pain: Sitting down. Flexing your knees up to your abdomen. Lying on your side  with one pillow under your abdomen and another pillow between your legs. Sitting in a warm bath for 15-20 minutes or until the pain goes away. Take over-the-counter and prescription medicines only as told by your health care provider. Move slowly when you sit down or stand up. Avoid long walks if they cause pain. Stop or reduce your physical activities if they cause pain. Keep all follow-up visits as told by your health care provider. This is important. Contact a health care provider if: Your pain does not go away with treatment. You feel pain in your back that you did not have before. Your medicine is not helping. Get help right away if: You have a fever or chills. You develop uterine contractions. You have vaginal bleeding. You have nausea or vomiting. You have diarrhea. You have pain when you urinate. Summary Round ligament pain is felt in the lower abdomen or groin. It is usually a short, sharp, and pinching pain.  It can also be a dull, lingering, and aching pain. This pain usually begins in the second trimester (13-28 weeks). It occurs because the uterus is stretching with the growing baby, and it is not harmful to the baby. You may notice the pain when you suddenly change position, when you cough or sneeze, or during physical activity. Relaxing, flexing your knees to your abdomen, lying on one side, or taking a warm bath may help to get rid of the pain. Get help from your health care provider if the pain does not go away or if you have vaginal bleeding, nausea, vomiting, diarrhea, or painful urination. This information is not intended to replace advice given to you by your health care provider. Make sure you discuss any questions you have with your healthcare provider. Document Revised: 03/03/2020 Document Reviewed: 09/03/2017 Elsevier Patient Education  2022 [employer].

## 2020-09-22 ENCOUNTER — Encounter: Payer: Self-pay | Admitting: Certified Nurse Midwife

## 2020-09-22 ENCOUNTER — Other Ambulatory Visit: Payer: Self-pay | Admitting: Certified Nurse Midwife

## 2020-09-22 ENCOUNTER — Ambulatory Visit: Payer: BC Managed Care – PPO | Attending: Certified Nurse Midwife

## 2020-09-22 ENCOUNTER — Other Ambulatory Visit: Payer: Self-pay

## 2020-09-22 DIAGNOSIS — O283 Abnormal ultrasonic finding on antenatal screening of mother: Secondary | ICD-10-CM | POA: Insufficient documentation

## 2020-09-22 DIAGNOSIS — F32A Depression, unspecified: Secondary | ICD-10-CM

## 2020-09-22 DIAGNOSIS — Z3A18 18 weeks gestation of pregnancy: Secondary | ICD-10-CM | POA: Diagnosis not present

## 2020-09-22 DIAGNOSIS — F419 Anxiety disorder, unspecified: Secondary | ICD-10-CM | POA: Insufficient documentation

## 2020-09-22 DIAGNOSIS — Z3482 Encounter for supervision of other normal pregnancy, second trimester: Secondary | ICD-10-CM

## 2020-09-25 NOTE — Progress Notes (Signed)
Her due date should not be changed.  It actually should be 10/29, as her first ultrasound was only 3 days different from her LMP.

## 2020-09-26 ENCOUNTER — Other Ambulatory Visit: Payer: Self-pay | Admitting: *Deleted

## 2020-09-26 DIAGNOSIS — Z3689 Encounter for other specified antenatal screening: Secondary | ICD-10-CM

## 2020-10-09 ENCOUNTER — Other Ambulatory Visit: Payer: Self-pay

## 2020-10-09 ENCOUNTER — Other Ambulatory Visit: Payer: Self-pay | Admitting: Certified Nurse Midwife

## 2020-10-09 ENCOUNTER — Ambulatory Visit (INDEPENDENT_AMBULATORY_CARE_PROVIDER_SITE_OTHER): Payer: BC Managed Care – PPO | Admitting: Certified Nurse Midwife

## 2020-10-09 ENCOUNTER — Other Ambulatory Visit (HOSPITAL_COMMUNITY)
Admission: RE | Admit: 2020-10-09 | Discharge: 2020-10-09 | Disposition: A | Discharge status: Home / Self Care | Payer: BC Managed Care – PPO | Source: Ambulatory Visit | Attending: Certified Nurse Midwife | Admitting: Certified Nurse Midwife

## 2020-10-09 ENCOUNTER — Encounter: Payer: Self-pay | Admitting: Certified Nurse Midwife

## 2020-10-09 VITALS — BP 102/66 | HR 76 | Wt 143.3 lb

## 2020-10-09 DIAGNOSIS — O26892 Other specified pregnancy related conditions, second trimester: Secondary | ICD-10-CM

## 2020-10-09 DIAGNOSIS — N898 Other specified noninflammatory disorders of vagina: Secondary | ICD-10-CM | POA: Insufficient documentation

## 2020-10-09 DIAGNOSIS — Z3482 Encounter for supervision of other normal pregnancy, second trimester: Secondary | ICD-10-CM

## 2020-10-09 DIAGNOSIS — O283 Abnormal ultrasonic finding on antenatal screening of mother: Secondary | ICD-10-CM

## 2020-10-09 DIAGNOSIS — Z8659 Personal history of other mental and behavioral disorders: Secondary | ICD-10-CM

## 2020-10-09 DIAGNOSIS — O99342 Other mental disorders complicating pregnancy, second trimester: Secondary | ICD-10-CM

## 2020-10-09 DIAGNOSIS — Z3A23 23 weeks gestation of pregnancy: Secondary | ICD-10-CM | POA: Diagnosis present

## 2020-10-09 DIAGNOSIS — F32A Depression, unspecified: Secondary | ICD-10-CM

## 2020-10-09 DIAGNOSIS — Z3402 Encounter for supervision of normal first pregnancy, second trimester: Secondary | ICD-10-CM

## 2020-10-09 LAB — POCT URINALYSIS DIPSTICK OB
Blood, UA: NEGATIVE
Glucose, UA: NEGATIVE
POC,PROTEIN,UA: NEGATIVE
Spec Grav, UA: 1.01 (ref 1.010–1.025)
Urobilinogen, UA: 0.2 E.U./dL
pH, UA: 7 (ref 5.0–8.0)

## 2020-10-09 MED ORDER — TERCONAZOLE 0.4 % VA CREA: 1.0000 | TOPICAL_CREAM | Freq: Every day | VAGINAL | 0 refills | Status: DC

## 2020-10-09 NOTE — Progress Notes (Signed)
ROB: She is doing well, thinks she may have a yeast infection. She has thick white discharge, self swab done.

## 2020-10-09 NOTE — Progress Notes (Signed)
ROB-Doing well, notes medication is helping with mood. Reports thick, white vaginal discharge; questions yeast infection. Self collection of swab, see orders. Rx Terazol, see chart. Naming son [name]. MFM ultrasound due EIF scheduled 10/20/2020. Anticipatory guidance regarding course of prenatal care. Reviewed red flag symptoms and when to call. RTC x 4 weeks for 28 week labs, TDaP, and ROB or sooner if needed.   Depression screen [facility] 2/9 10/09/2020 08/14/2020 07/19/2020  Decreased Interest 0 0 0  Down, Depressed, Hopeless 0 1 0  PHQ - 2 Score 0 1 0  Altered sleeping 0 0 -  Tired, decreased energy 0 3 -  Change in appetite 0 2 -  Feeling bad or failure about yourself  0 0 -  Trouble concentrating 0 1 -  Moving slowly or fidgety/restless 0 1 -  Suicidal thoughts 0 0 -  PHQ-9 Score 0 8 -  Difficult doing work/chores - Somewhat difficult -   GAD 7 : Generalized Anxiety Score 10/09/2020 08/14/2020  Nervous, Anxious, on Edge 1 2  Control/stop worrying 0 1  Worry too much - different things 0 1  Trouble relaxing 0 1  Restless 0 0  Easily annoyed or irritable 1 1  Afraid - awful might happen 0 0  Total GAD 7 Score 2 6  Anxiety Difficulty Not difficult at all Somewhat difficult

## 2020-10-09 NOTE — Patient Instructions (Signed)
Second Trimester of Pregnancy °The second trimester of pregnancy is from week 13 through week 27. This is also called months 4 through 6 of pregnancy. This is often the time when you feel your best. °During the second trimester: °Morning sickness is less or has stopped. °You may have more energy. °You may feel hungry more often. °At this time, your unborn baby (fetus) is growing very fast. At the end of the sixth month, the unborn baby may be up to 12 inches long and weigh about 1½ pounds. You will likely start to feel the baby move between 16 and 20 weeks of pregnancy. °Body changes during your second trimester °Your body continues to go through many changes during this time. The changes vary and generally return to normal after the baby is born. °Physical changes °You will gain more weight. °You may start to get stretch marks on your hips, belly (abdomen), and breasts. °Your breasts will grow and may hurt. °Dark spots or blotches may develop on your face. °A dark line from your belly button to the pubic area (linea nigra) may appear. °You may have changes in your hair. °Health changes °You may have headaches. °You may have heartburn. °You may have trouble pooping (constipation). °You may have hemorrhoids or swollen, bulging veins (varicose veins). °Your gums may bleed. °You may pee (urinate) more often. °You may have back pain. °Follow these instructions at home: °Medicines °Take over-the-counter and prescription medicines only as told by your doctor. Some medicines are not safe during pregnancy. °Take a prenatal vitamin that contains at least 600 micrograms (mcg) of folic acid. °Eating and drinking °Eat healthy meals that include: °Fresh fruits and vegetables. °Whole grains. °Good sources of protein, such as meat, eggs, or tofu. °Low-fat dairy products. °Avoid raw meat and unpasteurized juice, milk, and cheese. °You may need to take these actions to prevent or treat trouble pooping: °Drink enough fluids to keep  your pee (urine) pale yellow. °Eat foods that are high in fiber. These include beans, whole grains, and fresh fruits and vegetables. °Limit foods that are high in fat and sugar. These include fried or sweet foods. °Activity °Exercise only as told by your doctor. Most people can do their usual exercise during pregnancy. Try to exercise for 30 minutes at least 5 days a week. °Stop exercising if you have pain or cramps in your belly or lower back. °Do not exercise if it is too hot or too humid, or if you are in a place of great height (high altitude). °Avoid heavy lifting. °If you choose to, you may have sex unless your doctor tells you not to. °Relieving pain and discomfort °Wear a good support bra if your breasts are sore. °Take warm water baths (sitz baths) to soothe pain or discomfort caused by hemorrhoids. Use hemorrhoid cream if your doctor approves. °Rest with your legs raised (elevated) if you have leg cramps or low back pain. °If you develop bulging veins in your legs: °Wear support hose as told by your doctor. °Raise your feet for 15 minutes, 3-4 times a day. °Limit salt in your food. °Safety °Wear your seat belt at all times when you are in a car. °Talk with your doctor if someone is hurting you or yelling at you a lot. °Lifestyle °Do not use hot tubs, steam rooms, or saunas. °Do not douche. Do not use tampons or scented sanitary pads. °Avoid cat litter boxes and soil used by cats. These carry germs that can harm your baby and   can cause a loss of your baby by miscarriage or stillbirth. °Do not use herbal medicines, illegal drugs, or medicines that are not approved by your doctor. Do not drink alcohol. °Do not smoke or use any products that contain nicotine or tobacco. If you need help quitting, ask your doctor. °General instructions °Keep all follow-up visits. This is important. °Ask your doctor about local prenatal classes. °Ask your doctor about the right foods to eat or for help finding a  counselor. °Where to find more information °American Pregnancy Association: americanpregnancy.org °American College of Obstetricians and Gynecologists: www.acog.org °Office on Women's Health: womenshealth.gov/pregnancy °Contact a doctor if: °You have a headache that does not go away when you take medicine. °You have changes in how you see, or you see spots in front of your eyes. °You have mild cramps, pressure, or pain in your lower belly. °You continue to feel like you may vomit (nauseous), you vomit, or you have watery poop (diarrhea). °You have bad-smelling fluid coming from your vagina. °You have pain when you pee or your pee smells bad. °You have very bad swelling of your face, hands, ankles, feet, or legs. °You have a fever. °Get help right away if: °You are leaking fluid from your vagina. °You have spotting or bleeding from your vagina. °You have very bad belly cramping or pain. °You have trouble breathing. °You have chest pain. °You faint. °You have not felt your baby move for the time period told by your doctor. °You have new or increased pain, swelling, or redness in an arm or leg. °Summary °The second trimester of pregnancy is from week 13 through week 27 (months 4 through 6). °Eat healthy meals. °Exercise as told by your doctor. Most people can do their usual exercise during pregnancy. °Do not use herbal medicines, illegal drugs, or medicines that are not approved by your doctor. Do not drink alcohol. °Call your doctor if you get sick or if you notice anything unusual about your pregnancy. °This information is not intended to replace advice given to you by your health care provider. Make sure you discuss any questions you have with your health care provider. °Document Revised: 08/25/2019 Document Reviewed: 07/01/2019 °Elsevier Patient Education © 2022 Elsevier Inc. ° °

## 2020-10-10 LAB — CERVICOVAGINAL ANCILLARY ONLY
Bacterial Vaginitis (gardnerella): NEGATIVE
Candida Glabrata: NEGATIVE
Candida Vaginitis: POSITIVE — AB
Comment: NEGATIVE
Comment: NEGATIVE
Comment: NEGATIVE

## 2020-10-20 ENCOUNTER — Ambulatory Visit: Payer: BC Managed Care – PPO | Admitting: *Deleted

## 2020-10-20 ENCOUNTER — Ambulatory Visit: Payer: BC Managed Care – PPO | Attending: Obstetrics and Gynecology

## 2020-10-20 ENCOUNTER — Other Ambulatory Visit: Payer: Self-pay

## 2020-10-20 ENCOUNTER — Other Ambulatory Visit: Payer: Self-pay | Admitting: Maternal & Fetal Medicine

## 2020-10-20 ENCOUNTER — Encounter: Payer: Self-pay | Admitting: *Deleted

## 2020-10-20 VITALS — BP 108/69 | HR 58

## 2020-10-20 DIAGNOSIS — Z3689 Encounter for other specified antenatal screening: Secondary | ICD-10-CM | POA: Diagnosis present

## 2020-10-20 DIAGNOSIS — O283 Abnormal ultrasonic finding on antenatal screening of mother: Secondary | ICD-10-CM | POA: Diagnosis present

## 2020-10-20 DIAGNOSIS — O358XX0 Maternal care for other (suspected) fetal abnormality and damage, not applicable or unspecified: Secondary | ICD-10-CM

## 2020-10-20 DIAGNOSIS — O35BXX0 Maternal care for other (suspected) fetal abnormality and damage, fetal cardiac anomalies, not applicable or unspecified: Secondary | ICD-10-CM

## 2020-10-20 NOTE — Progress Notes (Signed)
58

## 2020-11-06 ENCOUNTER — Encounter: Payer: BC Managed Care – PPO | Admitting: Certified Nurse Midwife

## 2020-11-06 ENCOUNTER — Other Ambulatory Visit: Payer: BC Managed Care – PPO

## 2020-11-13 ENCOUNTER — Other Ambulatory Visit: Payer: Self-pay

## 2020-11-13 ENCOUNTER — Other Ambulatory Visit: Payer: BC Managed Care – PPO

## 2020-11-13 ENCOUNTER — Ambulatory Visit (INDEPENDENT_AMBULATORY_CARE_PROVIDER_SITE_OTHER): Payer: BC Managed Care – PPO | Admitting: Certified Nurse Midwife

## 2020-11-13 ENCOUNTER — Encounter: Payer: Self-pay | Admitting: Certified Nurse Midwife

## 2020-11-13 VITALS — BP 115/72 | HR 79 | Wt 149.1 lb

## 2020-11-13 DIAGNOSIS — Z23 Encounter for immunization: Secondary | ICD-10-CM

## 2020-11-13 DIAGNOSIS — Z3483 Encounter for supervision of other normal pregnancy, third trimester: Secondary | ICD-10-CM

## 2020-11-13 DIAGNOSIS — Z3A28 28 weeks gestation of pregnancy: Secondary | ICD-10-CM

## 2020-11-13 LAB — POCT URINALYSIS DIPSTICK OB
Bilirubin, UA: NEGATIVE
Blood, UA: NEGATIVE
Glucose, UA: NEGATIVE
Ketones, UA: NEGATIVE
Leukocytes, UA: NEGATIVE
Nitrite, UA: NEGATIVE
POC,PROTEIN,UA: NEGATIVE
Spec Grav, UA: 1.02 (ref 1.010–1.025)
Urobilinogen, UA: 0.2 E.U./dL
pH, UA: 6 (ref 5.0–8.0)

## 2020-11-13 MED ORDER — TETANUS-DIPHTH-ACELL PERTUSSIS 5-2.5-18.5 LF-MCG/0.5 IM SUSY
0.5000 mL | PREFILLED_SYRINGE | Freq: Once | INTRAMUSCULAR | Status: AC
  Administered 2020-11-13: 0.5 mL via INTRAMUSCULAR

## 2020-11-13 NOTE — Progress Notes (Signed)
ROB doing well. Feels good movement. 28 wk labs today: Glucose screen/RPR/CBC. Tdap, Blood transfusion consent completed, all questions answered. Ready set baby reviewed, see check list for topics covered. Sample birth plan given, will follow up in upcoming visits. Discussed birth control after delivery, information pamphlet given.   Follow up 2 wk for ROB or sooner if needed.    Nicholos Aloisi, CNM 

## 2020-11-13 NOTE — Patient Instructions (Signed)
Td (Tetanus, Diphtheria) Vaccine: What You Need to Know 1. Why get vaccinated? Td vaccine can prevent tetanus and diphtheria. Tetanus enters the body through cuts or wounds. Diphtheria spreads from person to person. TETANUS (T) causes painful stiffening of the muscles. Tetanus can lead to serious health problems, including being unable to open the mouth, having trouble swallowing and breathing, or death. DIPHTHERIA (D) can lead to difficulty breathing, heart failure, paralysis, or death. 2. Td vaccine Td is only for children 7 years and older, adolescents, and adults.  Td is usually given as a booster dose every 10 years, or after 5 years in the case of a severe or dirty wound or burn. Another vaccine, called "Tdap," may be used instead of Td. Tdap protects against pertussis, also known as "whooping cough," in addition to tetanus anddiphtheria. Td may be given at the same time as other vaccines. 3. Talk with your health care provider Tell your vaccination provider if the person getting the vaccine: Has had an allergic reaction after a previous dose of any vaccine that protects against tetanus or diphtheria, or has any severe, life-threatening allergies Has ever had Guillain-Barr Syndrome (also called "GBS") Has had severe pain or swelling after a previous dose of any vaccine that protects against tetanus or diphtheria In some cases, your health care provider may decide to postpone Td vaccinationuntil a future visit. People with minor illnesses, such as a cold, may be vaccinated. People who are moderately or severely ill should usually wait until they recover beforegetting Td vaccine.  Your health care provider can give you more information. 4. Risks of a vaccine reaction Pain, redness, or swelling where the shot was given, mild fever, headache, feeling tired, and nausea, vomiting, diarrhea, or stomachache sometimes happen after Td vaccination. People sometimes faint after medical procedures,  including vaccination. Tellyour provider if you feel dizzy or have vision changes or ringing in the ears.  As with any medicine, there is a very remote chance of a vaccine causing asevere allergic reaction, other serious injury, or death. 5. What if there is a serious problem? An allergic reaction could occur after the vaccinated person leaves the clinic. If you see signs of a severe allergic reaction (hives, swelling of the face and throat, difficulty breathing, a fast heartbeat, dizziness, or weakness), call 9-1-1and get the person to the nearest hospital.  For other signs that concern you, call your health care provider.  Adverse reactions should be reported to the Vaccine Adverse Event Reporting System (VAERS). Your health care provider will usually file this report, or you can do it yourself. Visit the VAERS website at www.vaers.hhs.gov or call 1-800-822-7967. VAERS is only for reporting reactions, and VAERS staff members do not give medical advice. 6. The National Vaccine Injury Compensation Program The National Vaccine Injury Compensation Program (VICP) is a federal program that was created to compensate people who may have been injured by certain vaccines. Claims regarding alleged injury or death due to vaccination have a time limit for filing, which may be as short as two years. Visit the VICP website at www.hrsa.gov/vaccinecompensation or call 1-800-338-2382to learn about the program and about filing a claim. 7. How can I learn more? Ask your health care provider. Call your local or state health department. Visit the website of the Food and Drug Administration (FDA) for vaccine package inserts and additional information at www.fda.gov/vaccines-blood-biologics/vaccines. Contact the Centers for Disease Control and Prevention (CDC): Call 1-800-232-4636 (1-800-CDC-INFO) or Visit CDC's website at www.cdc.gov/vaccines. Vaccine Information Statement   Td (Tetanus, Diphtheria) Vaccine (11/05/2019) This  information is not intended to replace advice given to you by your health care provider. Make sure you discuss any questions you have with your healthcare provider. Document Revised: 12/23/2019 Document Reviewed: 12/23/2019 Elsevier Patient Education  2022 Elsevier Inc.  

## 2020-11-14 ENCOUNTER — Other Ambulatory Visit: Payer: Self-pay | Admitting: Certified Nurse Midwife

## 2020-11-14 LAB — CBC
Hematocrit: 32.6 % — ABNORMAL LOW (ref 34.0–46.6)
Hemoglobin: 10.9 g/dL — ABNORMAL LOW (ref 11.1–15.9)
MCH: 29.5 pg (ref 26.6–33.0)
MCHC: 33.4 g/dL (ref 31.5–35.7)
MCV: 88 fL (ref 79–97)
Platelets: 307 10*3/uL (ref 150–450)
RBC: 3.69 x10E6/uL — ABNORMAL LOW (ref 3.77–5.28)
RDW: 12.3 % (ref 11.7–15.4)
WBC: 9.3 10*3/uL (ref 3.4–10.8)

## 2020-11-14 LAB — RPR: RPR Ser Ql: NONREACTIVE

## 2020-11-14 LAB — GLUCOSE, 1 HOUR GESTATIONAL: Gestational Diabetes Screen: 104 mg/dL (ref 65–139)

## 2020-11-14 MED ORDER — FUSION PLUS PO CAPS: 1.0000 | ORAL_CAPSULE | Freq: Every day | ORAL | 10 refills | Status: DC

## 2020-11-17 ENCOUNTER — Ambulatory Visit: Payer: BC Managed Care – PPO

## 2020-11-21 ENCOUNTER — Ambulatory Visit (HOSPITAL_BASED_OUTPATIENT_CLINIC_OR_DEPARTMENT_OTHER): Payer: BC Managed Care – PPO

## 2020-11-21 ENCOUNTER — Ambulatory Visit: Payer: BC Managed Care – PPO | Attending: Obstetrics and Gynecology | Admitting: *Deleted

## 2020-11-21 ENCOUNTER — Other Ambulatory Visit: Payer: Self-pay

## 2020-11-21 ENCOUNTER — Encounter: Payer: Self-pay | Admitting: *Deleted

## 2020-11-21 VITALS — BP 105/57 | HR 66

## 2020-11-21 DIAGNOSIS — O35BXX0 Maternal care for other (suspected) fetal abnormality and damage, fetal cardiac anomalies, not applicable or unspecified: Secondary | ICD-10-CM

## 2020-11-21 DIAGNOSIS — O283 Abnormal ultrasonic finding on antenatal screening of mother: Secondary | ICD-10-CM

## 2020-11-21 DIAGNOSIS — Z3A19 19 weeks gestation of pregnancy: Secondary | ICD-10-CM | POA: Insufficient documentation

## 2020-11-21 DIAGNOSIS — Z3A29 29 weeks gestation of pregnancy: Secondary | ICD-10-CM

## 2020-11-21 DIAGNOSIS — O358XX0 Maternal care for other (suspected) fetal abnormality and damage, not applicable or unspecified: Secondary | ICD-10-CM | POA: Diagnosis present

## 2020-11-21 DIAGNOSIS — Z362 Encounter for other antenatal screening follow-up: Secondary | ICD-10-CM

## 2020-11-28 ENCOUNTER — Ambulatory Visit (INDEPENDENT_AMBULATORY_CARE_PROVIDER_SITE_OTHER): Payer: BC Managed Care – PPO | Admitting: Certified Nurse Midwife

## 2020-11-28 ENCOUNTER — Other Ambulatory Visit: Payer: Self-pay

## 2020-11-28 VITALS — BP 116/70 | HR 83 | Wt 154.6 lb

## 2020-11-28 DIAGNOSIS — Z3483 Encounter for supervision of other normal pregnancy, third trimester: Secondary | ICD-10-CM

## 2020-11-28 DIAGNOSIS — Z3A30 30 weeks gestation of pregnancy: Secondary | ICD-10-CM

## 2020-11-28 LAB — POCT URINALYSIS DIPSTICK OB
Bilirubin, UA: NEGATIVE
Blood, UA: NEGATIVE
Glucose, UA: NEGATIVE
Ketones, UA: NEGATIVE
Leukocytes, UA: NEGATIVE
Nitrite, UA: NEGATIVE
POC,PROTEIN,UA: NEGATIVE
Spec Grav, UA: 1.01 (ref 1.010–1.025)
Urobilinogen, UA: 0.2 E.U./dL
pH, UA: 7 (ref 5.0–8.0)

## 2020-11-28 NOTE — Patient Instructions (Signed)
Fetal Movement Counts Patient Name: ________________________________________________ Patient DueDate: ____________________ What is a fetal movement count?  A fetal movement count is the number of times that you feel your baby move during a certain amount of time. This may also be called a fetal kick count. A fetal movement count is recommended for every pregnant woman. You may be askedto start counting fetal movements as early as week 28 of your pregnancy. Pay attention to when your baby is most active. You may notice your baby's sleep and wake cycles. You may also notice things that make your baby move more. You should do a fetal movement count: When your baby is normally most active. At the same time each day. A good time to count movements is while you are resting, after having somethingto eat and drink. How do I count fetal movements? Find a quiet, comfortable area. Sit, or lie down on your side. Write down the date, the start time and stop time, and the number of movements that you felt between those two times. Take this information with you to your health care visits. Write down your start time when you feel the first movement. Count kicks, flutters, swishes, rolls, and jabs. You should feel at least 10 movements. You may stop counting after you have felt 10 movements, or if you have been counting for 2 hours. Write down the stop time. If you do not feel 10 movements in 2 hours, contact your health care provider for further instructions. Your health care provider may want to do additional tests to assess your baby's well-being. Contact a health care provider if: You feel fewer than 10 movements in 2 hours. Your baby is not moving like he or she usually does. Date: ____________ Start time: ____________ Stop time: ____________ Movements:____________ Date: ____________ Start time: ____________ Stop time: ____________ Movements:____________ Date: ____________ Start time: ____________ Stop  time: ____________ Movements:____________ Date: ____________ Start time: ____________ Stop time: ____________ Movements:____________ Date: ____________ Start time: ____________ Stop time: ____________ Movements:____________ Date: ____________ Start time: ____________ Stop time: ____________ Movements:____________ Date: ____________ Start time: ____________ Stop time: ____________ Movements:____________ Date: ____________ Start time: ____________ Stop time: ____________ Movements:____________ Date: ____________ Start time: ____________ Stop time: ____________ Movements:____________ This information is not intended to replace advice given to you by your health care provider. Make sure you discuss any questions you have with your healthcare provider. Document Revised: 11/05/2018 Document Reviewed: 11/05/2018 Elsevier Patient Education  2022 Elsevier Inc.  

## 2020-11-28 NOTE — Progress Notes (Signed)
Rob doing well, feeling good movement. Follow up on birth plan and birth control today. Pt feels strongly about avoiding Epidural . Would like natural birth but is considering IV/nitrus if necessary. Plans POP for contraception. Follow up 2 wks for ROB.   Doreene Burke, CNM

## 2020-12-22 ENCOUNTER — Other Ambulatory Visit: Payer: Self-pay

## 2020-12-22 ENCOUNTER — Encounter: Payer: Self-pay | Admitting: Certified Nurse Midwife

## 2020-12-22 ENCOUNTER — Ambulatory Visit (INDEPENDENT_AMBULATORY_CARE_PROVIDER_SITE_OTHER): Payer: BC Managed Care – PPO | Admitting: Certified Nurse Midwife

## 2020-12-22 VITALS — BP 109/73 | HR 101 | Wt 157.7 lb

## 2020-12-22 DIAGNOSIS — Z3483 Encounter for supervision of other normal pregnancy, third trimester: Secondary | ICD-10-CM

## 2020-12-22 DIAGNOSIS — Z3A33 33 weeks gestation of pregnancy: Secondary | ICD-10-CM

## 2020-12-22 LAB — POCT URINALYSIS DIPSTICK OB
Bilirubin, UA: NEGATIVE
Blood, UA: NEGATIVE
Glucose, UA: NEGATIVE
Ketones, UA: NEGATIVE
Leukocytes, UA: NEGATIVE
Nitrite, UA: NEGATIVE
POC,PROTEIN,UA: NEGATIVE
Spec Grav, UA: 1.015 (ref 1.010–1.025)
Urobilinogen, UA: 0.2 E.U./dL
pH, UA: 6.5 (ref 5.0–8.0)

## 2020-12-22 NOTE — Patient Instructions (Signed)
Fetal Movement Counts Patient Name: ________________________________________________ Patient DueDate: ____________________ What is a fetal movement count?  A fetal movement count is the number of times that you feel your baby move during a certain amount of time. This may also be called a fetal kick count. A fetal movement count is recommended for every pregnant woman. You may be askedto start counting fetal movements as early as week 28 of your pregnancy. Pay attention to when your baby is most active. You may notice your baby's sleep and wake cycles. You may also notice things that make your baby move more. You should do a fetal movement count: When your baby is normally most active. At the same time each day. A good time to count movements is while you are resting, after having somethingto eat and drink. How do I count fetal movements? Find a quiet, comfortable area. Sit, or lie down on your side. Write down the date, the start time and stop time, and the number of movements that you felt between those two times. Take this information with you to your health care visits. Write down your start time when you feel the first movement. Count kicks, flutters, swishes, rolls, and jabs. You should feel at least 10 movements. You may stop counting after you have felt 10 movements, or if you have been counting for 2 hours. Write down the stop time. If you do not feel 10 movements in 2 hours, contact your health care provider for further instructions. Your health care provider may want to do additional tests to assess your baby's well-being. Contact a health care provider if: You feel fewer than 10 movements in 2 hours. Your baby is not moving like he or she usually does. Date: ____________ Start time: ____________ Stop time: ____________ Movements:____________ Date: ____________ Start time: ____________ Stop time: ____________ Movements:____________ Date: ____________ Start time: ____________ Stop  time: ____________ Movements:____________ Date: ____________ Start time: ____________ Stop time: ____________ Movements:____________ Date: ____________ Start time: ____________ Stop time: ____________ Movements:____________ Date: ____________ Start time: ____________ Stop time: ____________ Movements:____________ Date: ____________ Start time: ____________ Stop time: ____________ Movements:____________ Date: ____________ Start time: ____________ Stop time: ____________ Movements:____________ Date: ____________ Start time: ____________ Stop time: ____________ Movements:____________ This information is not intended to replace advice given to you by your health care provider. Make sure you discuss any questions you have with your healthcare provider. Document Revised: 11/05/2018 Document Reviewed: 11/05/2018 Elsevier Patient Education  2022 Elsevier Inc.  

## 2020-12-22 NOTE — Progress Notes (Signed)
ROB doing well. Feeling good movement. Discussed MD coverage of midwifery service and option to meet the MDs. She is in agreement and plans to see them at her 9 & 38 wk visit. GBS and culture for next visit reviewed. Follow up 2 wk .   Doreene Burke, CNM

## 2021-01-10 ENCOUNTER — Ambulatory Visit (INDEPENDENT_AMBULATORY_CARE_PROVIDER_SITE_OTHER): Payer: BC Managed Care – PPO | Admitting: Certified Nurse Midwife

## 2021-01-10 ENCOUNTER — Other Ambulatory Visit: Payer: Self-pay

## 2021-01-10 VITALS — BP 115/73 | HR 76 | Wt 161.5 lb

## 2021-01-10 DIAGNOSIS — Z3A36 36 weeks gestation of pregnancy: Secondary | ICD-10-CM

## 2021-01-10 DIAGNOSIS — Z3483 Encounter for supervision of other normal pregnancy, third trimester: Secondary | ICD-10-CM

## 2021-01-10 LAB — POCT URINALYSIS DIPSTICK OB
Bilirubin, UA: NEGATIVE
Blood, UA: NEGATIVE
Glucose, UA: NEGATIVE
Ketones, UA: NEGATIVE
Leukocytes, UA: NEGATIVE
Nitrite, UA: NEGATIVE
POC,PROTEIN,UA: NEGATIVE
Spec Grav, UA: 1.015 (ref 1.010–1.025)
Urobilinogen, UA: 0.2 E.U./dL
pH, UA: 6.5 (ref 5.0–8.0)

## 2021-01-10 NOTE — Progress Notes (Signed)
Pt declines flu 

## 2021-01-10 NOTE — Progress Notes (Signed)
Body mass index is 25.29 kg/m.  ROB doing well, feeling regular movement. GBS and cultures today. Herbal prep hand out given. Discussed SVE up coming visit if she is interested.. She verbalizes and agrees.  Has next appointments with MDs to meet them. Follow up as scheduled.   Doreene Burke, CNM

## 2021-01-10 NOTE — Patient Instructions (Signed)

## 2021-01-13 LAB — STREP GP B NAA: Strep Gp B NAA: NEGATIVE

## 2021-01-15 LAB — GC/CHLAMYDIA PROBE AMP
Chlamydia trachomatis, NAA: NEGATIVE
Neisseria Gonorrhoeae by PCR: NEGATIVE

## 2021-01-17 ENCOUNTER — Other Ambulatory Visit: Payer: Self-pay

## 2021-01-17 ENCOUNTER — Encounter: Payer: Self-pay | Admitting: Obstetrics and Gynecology

## 2021-01-17 ENCOUNTER — Ambulatory Visit (INDEPENDENT_AMBULATORY_CARE_PROVIDER_SITE_OTHER): Payer: BC Managed Care – PPO | Admitting: Obstetrics and Gynecology

## 2021-01-17 VITALS — BP 96/67 | HR 80 | Wt 163.8 lb

## 2021-01-17 DIAGNOSIS — Z3A37 37 weeks gestation of pregnancy: Secondary | ICD-10-CM

## 2021-01-17 DIAGNOSIS — Z3483 Encounter for supervision of other normal pregnancy, third trimester: Secondary | ICD-10-CM

## 2021-01-17 LAB — POCT URINALYSIS DIPSTICK OB
Bilirubin, UA: NEGATIVE
Blood, UA: NEGATIVE
Glucose, UA: NEGATIVE
Ketones, UA: NEGATIVE
Leukocytes, UA: NEGATIVE
Nitrite, UA: NEGATIVE
POC,PROTEIN,UA: NEGATIVE
Spec Grav, UA: 1.01 (ref 1.010–1.025)
Urobilinogen, UA: 0.2 E.U./dL
pH, UA: 6 (ref 5.0–8.0)

## 2021-01-17 NOTE — Progress Notes (Signed)
ROB: She has some pressure and low back pain. No other concerns.

## 2021-01-17 NOTE — Progress Notes (Signed)
ROB: Occasional Braxton Hicks but no "real contractions".  Patient is uncomfortable but generally doing well.  Reviewed lab work from 36 weeks.

## 2021-01-21 ENCOUNTER — Other Ambulatory Visit: Payer: Self-pay

## 2021-01-21 ENCOUNTER — Inpatient Hospital Stay: Payer: BC Managed Care – PPO | Admitting: Anesthesiology

## 2021-01-21 ENCOUNTER — Encounter: Payer: Self-pay | Admitting: Obstetrics and Gynecology

## 2021-01-21 ENCOUNTER — Inpatient Hospital Stay
Admission: EM | Admit: 2021-01-21 | Discharge: 2021-01-22 | DRG: 807 | Disposition: A | Discharge status: Home / Self Care | Payer: BC Managed Care – PPO | Attending: Obstetrics and Gynecology | Admitting: Obstetrics and Gynecology

## 2021-01-21 DIAGNOSIS — F419 Anxiety disorder, unspecified: Secondary | ICD-10-CM | POA: Diagnosis not present

## 2021-01-21 DIAGNOSIS — O99344 Other mental disorders complicating childbirth: Secondary | ICD-10-CM | POA: Diagnosis not present

## 2021-01-21 DIAGNOSIS — Z20822 Contact with and (suspected) exposure to covid-19: Secondary | ICD-10-CM | POA: Diagnosis present

## 2021-01-21 DIAGNOSIS — O26893 Other specified pregnancy related conditions, third trimester: Secondary | ICD-10-CM | POA: Diagnosis present

## 2021-01-21 DIAGNOSIS — Z3A38 38 weeks gestation of pregnancy: Secondary | ICD-10-CM

## 2021-01-21 DIAGNOSIS — O283 Abnormal ultrasonic finding on antenatal screening of mother: Secondary | ICD-10-CM

## 2021-01-21 LAB — CBC
HCT: 34.5 % — ABNORMAL LOW (ref 36.0–46.0)
Hemoglobin: 12 g/dL (ref 12.0–15.0)
MCH: 30.2 pg (ref 26.0–34.0)
MCHC: 34.8 g/dL (ref 30.0–36.0)
MCV: 86.7 fL (ref 80.0–100.0)
Platelets: 349 10*3/uL (ref 150–400)
RBC: 3.98 MIL/uL (ref 3.87–5.11)
RDW: 13.4 % (ref 11.5–15.5)
WBC: 13.3 10*3/uL — ABNORMAL HIGH (ref 4.0–10.5)
nRBC: 0 % (ref 0.0–0.2)

## 2021-01-21 LAB — RESP PANEL BY RT-PCR (FLU A&B, COVID) ARPGX2
Influenza A by PCR: NEGATIVE
Influenza B by PCR: NEGATIVE
SARS Coronavirus 2 by RT PCR: NEGATIVE

## 2021-01-21 LAB — TYPE AND SCREEN
ABO/RH(D): AB POS
Antibody Screen: NEGATIVE

## 2021-01-21 LAB — RUPTURE OF MEMBRANE (ROM)PLUS: Rom Plus: POSITIVE

## 2021-01-21 MED ORDER — DOCUSATE SODIUM 100 MG PO CAPS
100.0000 mg | ORAL_CAPSULE | Freq: Two times a day (BID) | ORAL | Status: DC
  Administered 2021-01-21 – 2021-01-22 (×2): 100 mg via ORAL
  Filled 2021-01-21: qty 1

## 2021-01-21 MED ORDER — BENZOCAINE-MENTHOL 20-0.5 % EX AERO
1.0000 "application " | INHALATION_SPRAY | CUTANEOUS | Status: DC | PRN
  Administered 2021-01-21: 1 via TOPICAL
  Filled 2021-01-21: qty 56

## 2021-01-21 MED ORDER — OXYCODONE-ACETAMINOPHEN 5-325 MG PO TABS: 2.0000 | ORAL_TABLET | ORAL | Status: DC | PRN

## 2021-01-21 MED ORDER — FLEET ENEMA 7-19 GM/118ML RE ENEM: 1.0000 | ENEMA | RECTAL | Status: DC | PRN

## 2021-01-21 MED ORDER — LACTATED RINGERS IV SOLN: 500.0000 mL | INTRAVENOUS | Status: DC | PRN

## 2021-01-21 MED ORDER — PHENYLEPHRINE 40 MCG/ML (10ML) SYRINGE FOR IV PUSH (FOR BLOOD PRESSURE SUPPORT): 80.0000 ug | PREFILLED_SYRINGE | INTRAVENOUS | Status: DC | PRN

## 2021-01-21 MED ORDER — AMMONIA AROMATIC IN INHA
RESPIRATORY_TRACT | Status: AC
  Filled 2021-01-21: qty 10

## 2021-01-21 MED ORDER — BUTORPHANOL TARTRATE 1 MG/ML IJ SOLN
1.0000 mg | INTRAMUSCULAR | Status: DC | PRN
Start: 2021-01-21 — End: 2021-01-21
  Administered 2021-01-21: 1 mg via INTRAVENOUS
  Filled 2021-01-21: qty 1

## 2021-01-21 MED ORDER — ZOLPIDEM TARTRATE 5 MG PO TABS: 5.0000 mg | ORAL_TABLET | Freq: Every evening | ORAL | Status: DC | PRN

## 2021-01-21 MED ORDER — MISOPROSTOL 200 MCG PO TABS
ORAL_TABLET | ORAL | Status: AC
  Filled 2021-01-21: qty 4

## 2021-01-21 MED ORDER — ACETAMINOPHEN 325 MG PO TABS
650.0000 mg | ORAL_TABLET | ORAL | Status: DC | PRN
  Administered 2021-01-21 – 2021-01-22 (×3): 650 mg via ORAL
  Filled 2021-01-21 (×3): qty 2

## 2021-01-21 MED ORDER — SOD CITRATE-CITRIC ACID 500-334 MG/5ML PO SOLN: 30.0000 mL | ORAL | Status: DC | PRN

## 2021-01-21 MED ORDER — LIDOCAINE HCL (PF) 1 % IJ SOLN
30.0000 mL | INTRAMUSCULAR | Status: DC | PRN
Start: 2021-01-21 — End: 2021-01-21

## 2021-01-21 MED ORDER — FENTANYL-BUPIVACAINE-NACL 0.5-0.125-0.9 MG/250ML-% EP SOLN
EPIDURAL | Status: AC
  Filled 2021-01-21: qty 250

## 2021-01-21 MED ORDER — MISOPROSTOL 50MCG HALF TABLET
ORAL_TABLET | ORAL | Status: AC
  Filled 2021-01-21: qty 1

## 2021-01-21 MED ORDER — PRENATAL MULTIVITAMIN CH
1.0000 | ORAL_TABLET | Freq: Every day | ORAL | Status: DC
  Administered 2021-01-21 – 2021-01-22 (×2): 1 via ORAL
  Filled 2021-01-21 (×2): qty 1

## 2021-01-21 MED ORDER — OXYCODONE-ACETAMINOPHEN 5-325 MG PO TABS: 1.0000 | ORAL_TABLET | ORAL | Status: DC | PRN

## 2021-01-21 MED ORDER — DIPHENHYDRAMINE HCL 50 MG/ML IJ SOLN: 12.5000 mg | INTRAMUSCULAR | Status: DC | PRN

## 2021-01-21 MED ORDER — LACTATED RINGERS IV SOLN: INTRAVENOUS | Status: DC

## 2021-01-21 MED ORDER — TETANUS-DIPHTH-ACELL PERTUSSIS 5-2.5-18.5 LF-MCG/0.5 IM SUSY
0.5000 mL | PREFILLED_SYRINGE | Freq: Once | INTRAMUSCULAR | Status: DC
  Filled 2021-01-21: qty 0.5

## 2021-01-21 MED ORDER — BUPIVACAINE HCL (PF) 0.25 % IJ SOLN
INTRAMUSCULAR | Status: DC | PRN
  Administered 2021-01-21: 8 mL via EPIDURAL

## 2021-01-21 MED ORDER — ACETAMINOPHEN 325 MG PO TABS: 650.0000 mg | ORAL_TABLET | ORAL | Status: DC | PRN

## 2021-01-21 MED ORDER — OXYTOCIN-SODIUM CHLORIDE 30-0.9 UT/500ML-% IV SOLN: 2.5000 [IU]/h | INTRAVENOUS | Status: DC | PRN

## 2021-01-21 MED ORDER — IBUPROFEN 600 MG PO TABS
600.0000 mg | ORAL_TABLET | Freq: Four times a day (QID) | ORAL | Status: DC
  Administered 2021-01-21 – 2021-01-22 (×4): 600 mg via ORAL
  Filled 2021-01-21 (×4): qty 1

## 2021-01-21 MED ORDER — SIMETHICONE 80 MG PO CHEW: 80.0000 mg | CHEWABLE_TABLET | ORAL | Status: DC | PRN

## 2021-01-21 MED ORDER — EPHEDRINE 5 MG/ML INJ: 10.0000 mg | INTRAVENOUS | Status: DC | PRN

## 2021-01-21 MED ORDER — LACTATED RINGERS IV SOLN
500.0000 mL | Freq: Once | INTRAVENOUS | Status: DC
  Administered 2021-01-21: 500 mL via INTRAVENOUS

## 2021-01-21 MED ORDER — OXYTOCIN-SODIUM CHLORIDE 30-0.9 UT/500ML-% IV SOLN
2.5000 [IU]/h | INTRAVENOUS | Status: DC
  Filled 2021-01-21: qty 500

## 2021-01-21 MED ORDER — FENTANYL-BUPIVACAINE-NACL 0.5-0.125-0.9 MG/250ML-% EP SOLN
12.0000 mL/h | EPIDURAL | Status: DC | PRN
Start: 2021-01-21 — End: 2021-01-21

## 2021-01-21 MED ORDER — LIDOCAINE HCL (PF) 1 % IJ SOLN
INTRAMUSCULAR | Status: AC
  Filled 2021-01-21: qty 30

## 2021-01-21 MED ORDER — ONDANSETRON HCL 4 MG/2ML IJ SOLN: 4.0000 mg | Freq: Four times a day (QID) | INTRAMUSCULAR | Status: DC | PRN

## 2021-01-21 MED ORDER — LIDOCAINE-EPINEPHRINE (PF) 1.5 %-1:200000 IJ SOLN
INTRAMUSCULAR | Status: DC | PRN
  Administered 2021-01-21: 3 mL via EPIDURAL
  Administered 2021-01-21: 2 mL via EPIDURAL

## 2021-01-21 MED ORDER — OXYTOCIN BOLUS FROM INFUSION
333.0000 mL | Freq: Once | INTRAVENOUS | Status: AC
  Administered 2021-01-21: 333 mL via INTRAVENOUS

## 2021-01-21 MED ORDER — DIPHENHYDRAMINE HCL 25 MG PO CAPS: 25.0000 mg | ORAL_CAPSULE | Freq: Four times a day (QID) | ORAL | Status: DC | PRN

## 2021-01-21 MED ORDER — OXYTOCIN 10 UNIT/ML IJ SOLN
INTRAMUSCULAR | Status: AC
  Filled 2021-01-21: qty 2

## 2021-01-21 NOTE — OB Triage Note (Signed)
Patient is a 30 yo, G2P1, at 38 weeks 1 days. Patient presents with complaints of contractions and possible ROM that started at approximately 0200 on 01/21/21. Patient denies any vaginal bleeding and reports +FM. Monitors applied and assessing. VSS. Initial fetal heart tone 135. Will continue to monitor.

## 2021-01-21 NOTE — Anesthesia Preprocedure Evaluation (Addendum)
Anesthesia Evaluation  Patient identified by MRN, date of birth, ID band Patient awake    Reviewed: Allergy & Precautions, NPO status , Patient's Chart, lab work & pertinent test results  History of Anesthesia Complications Negative for: history of anesthetic complications  Airway Mallampati: III   Neck ROM: Full    Dental   Pulmonary neg pulmonary ROS,    Pulmonary exam normal breath sounds clear to auscultation       Cardiovascular Exercise Tolerance: Good negative cardio ROS Normal cardiovascular exam Rhythm:Regular Rate:Normal     Neuro/Psych PSYCHIATRIC DISORDERS Anxiety Depression negative neurological ROS     GI/Hepatic negative GI ROS,   Endo/Other  negative endocrine ROS  Renal/GU negative Renal ROS     Musculoskeletal   Abdominal   Peds  Hematology negative hematology ROS (+)   Anesthesia Other Findings   Reproductive/Obstetrics                            Anesthesia Physical Anesthesia Plan  ASA: 2  Anesthesia Plan: Epidural   Post-op Pain Management:    Induction:   PONV Risk Score and Plan: 2 and Treatment may vary due to age or medical condition  Airway Management Planned:   Additional Equipment:   Intra-op Plan:   Post-operative Plan:   Informed Consent: I have reviewed the patients History and Physical, chart, labs and discussed the procedure including the risks, benefits and alternatives for the proposed anesthesia with the patient or authorized representative who has indicated his/her understanding and acceptance.       Plan Discussed with:   Anesthesia Plan Comments: (Pt at 8 cm when called.  Upon arrival to L&D, pt with only lip remaining; pt still wishes to have epidural placed.)       Anesthesia Quick Evaluation

## 2021-01-21 NOTE — Anesthesia Postprocedure Evaluation (Signed)
Anesthesia Post Note  Patient: Deborah Lester  Procedure(s) Performed: AN AD HOC LABOR EPIDURAL  Patient location during evaluation: Mother Baby Anesthesia Type: Epidural Level of consciousness: awake and alert, oriented and patient cooperative Pain management: pain level controlled Vital Signs Assessment: post-procedure vital signs reviewed and stable Respiratory status: spontaneous breathing, nonlabored ventilation and respiratory function stable Cardiovascular status: blood pressure returned to baseline and stable Postop Assessment: adequate PO intake, no headache and able to ambulate Anesthetic complications: no   No notable events documented.   Last Vitals:  Vitals:   01/21/21 1441 01/21/21 1609  BP: 110/67 102/62  Pulse: 97 82  Resp: 20 16  Temp: 37.1 C 37 C  SpO2: 98% 98%    Last Pain:  Vitals:   01/21/21 1609  TempSrc: Oral  PainSc:                  Reed Breech

## 2021-01-21 NOTE — Anesthesia Procedure Notes (Signed)
Epidural Patient location during procedure: OB Start time: 01/21/2021 10:55 AM End time: 01/21/2021 11:00 AM  Staffing Anesthesiologist: Reed Breech, MD Performed: anesthesiologist   Preanesthetic Checklist Completed: patient identified, IV checked, risks and benefits discussed, monitors and equipment checked, pre-op evaluation and timeout performed  Epidural Patient position: sitting Prep: ChloraPrep Patient monitoring: heart rate, continuous pulse ox and blood pressure Approach: midline Location: L3-L4 Injection technique: LOR air  Needle:  Needle type: Tuohy  Needle gauge: 17 G Needle length: 9 cm Needle insertion depth: 4 cm Catheter type: closed end flexible Catheter at skin depth: 9 cm Test dose: negative and 1.5% lidocaine with Epi 1:200 K  Additional Notes Pt complete and ready to push upon completion of epidural.Reason for block:procedure for pain

## 2021-01-21 NOTE — Progress Notes (Signed)
Deborah Lester is stable after delivery.Pt's support person is at bedside. Pt bonding with infant and performing skin to skin after delivery. Epidural catheter removed by RN, tip intact, no bleeding noted at site. Pt is stable and ambulated to the bathroom, voided a sufficient amount, and tolerated activity well. Pt ambulated to the wheelchair and transferred to mother/baby unit RM [id number]  for couplet care. Report given to Ann Held .

## 2021-01-21 NOTE — H&P (Signed)
History and Physical   HPI  Deborah Lester is a 30 y.o. G2P1001 at [redacted]w[redacted]d Estimated Date of Delivery: 02/03/21 who is being admitted for labor management after SROM - clear - 5AM   OB History  OB History  Gravida Para Term Preterm AB Living  2 1 1  0 0 1  SAB IAB Ectopic Multiple Live Births  0 0 0 0 1    # Outcome Date GA Lbr Len/2nd Weight Sex Delivery Anes PTL Lv  2 Current           1 Term 05/08/17   3034 g F Vag-Spont  N LIV    PROBLEM LIST  Pregnancy complications or risks: Patient Active Problem List   Diagnosis Date Noted   Indication for care in labor and delivery, antepartum 01/21/2021   Echogenic intracardiac focus of fetus on prenatal ultrasound 09/22/2020   Pregnancy headache in second trimester 08/14/2020   Hx of anxiety disorder 08/14/2020    Prenatal labs and studies: ABO, Rh: --/--/AB POS (10/23 01-17-1975) Antibody: NEG (10/23 0811) Rubella: 2.48 (04/08 1412) RPR: Non Reactive (08/15 0930)  HBsAg: Negative (04/08 1412)  HIV: Non Reactive (04/08 1412)  09-16-2002-- (10/13 1448)   Past Medical History:  Diagnosis Date   Anemia affecting pregnancy    Anxiety    Depression      Past Surgical History:  Procedure Laterality Date   BREAST MASS EXCISION  2015   FOOT SURGERY  2014   WISDOM TOOTH EXTRACTION  2011     Medications    Current Discharge Medication List     CONTINUE these medications which have NOT CHANGED   Details  Iron-FA-B Cmp-C-Biot-Probiotic (FUSION PLUS) CAPS Take 1 tablet by mouth daily. Qty: 30 capsule, Refills: 10    Prenatal Vit-Fe Fumarate-FA (PRENATAL MULTIVITAMIN) TABS tablet Take 1 tablet by mouth daily at 12 noon.    sertraline (ZOLOFT) 25 MG tablet TAKE 1 TABLET (25 MG TOTAL) BY MOUTH DAILY. Qty: 90 tablet, Refills: 1   Associated Diagnoses: Hx of anxiety disorder; Depression affecting pregnancy in second trimester, antepartum    Doxylamine-Pyridoxine 10-10 MG TBEC Take 1 tablet by mouth 4 (four) times  daily. Day 1 &2: 2 tablet at bedtimeDay 3 : if symptoms persists 1 tablet am; 2 tablet at bedtimeDay 4: 1 tablet am, 1 tab afternoon, 2 tab at bedtime Qty: 120 tablet, Refills: 5    terconazole (TERAZOL 7) 0.4 % vaginal cream Place 1 applicator vaginally at bedtime. Qty: 45 g, Refills: 0         Allergies  Patient has no known allergies.  Review of Systems  Pertinent items noted in HPI and remainder of comprehensive ROS otherwise negative.  Physical Exam  BP 119/72 (BP Location: Left Arm)   Pulse 79   Temp (!) 97.4 F (36.3 C) (Oral)   Resp 14   LMP 04/22/2020   Lungs:  CTA B Cardio: RRR without M/R/G Abd: Soft, gravid, NT Presentation: cephalic EXT: No C/C/ 1+ Edema DTRs: 2+ B CERVIX: Dilation: 4 Effacement (%): 100 Cervical Position: Middle Presentation: Vertex Exam by:: Galen Russman,MD  See Prenatal records for more detailed PE.     FHR:  Variability: Good {> 6 bpm)  Toco: Uterine Contractions: Q 2 min  Test Results  Results for orders placed or performed during the hospital encounter of 01/21/21 (from the past 24 hour(s))  ROM Plus ([facility] only)     Status: None   Collection Time: 01/21/21  6:59 AM  Result Value Ref Range   Rom Plus POSITIVE   CBC     Status: Abnormal   Collection Time: 01/21/21  8:11 AM  Result Value Ref Range   WBC 13.3 (H) 4.0 - 10.5 K/uL   RBC 3.98 3.87 - 5.11 MIL/uL   Hemoglobin 12.0 12.0 - 15.0 g/dL   HCT 99.3 (L) 71.6 - 96.7 %   MCV 86.7 80.0 - 100.0 fL   MCH 30.2 26.0 - 34.0 pg   MCHC 34.8 30.0 - 36.0 g/dL   RDW 89.3 81.0 - 17.5 %   Platelets 349 150 - 400 K/uL   nRBC 0.0 0.0 - 0.2 %  Type and screen [facility]     Status: None   Collection Time: 01/21/21  8:11 AM  Result Value Ref Range   ABO/RH(D) AB POS    Antibody Screen NEG    Sample Expiration      01/24/2021,2359 Performed at [facility], [address] [place], [place] [address]   Resp Panel by RT-PCR (Flu A&B, Covid)  Nasopharyngeal Swab     Status: None   Collection Time: 01/21/21  8:11 AM   Specimen: Nasopharyngeal Swab; Nasopharyngeal(NP) swabs in vial transport medium  Result Value Ref Range   SARS Coronavirus 2 by RT PCR NEGATIVE NEGATIVE   Influenza A by PCR NEGATIVE NEGATIVE   Influenza B by PCR NEGATIVE NEGATIVE   Group B Strep negative  Assessment   G2P1001 at [redacted]w[redacted]d Estimated Date of Delivery: 02/03/21  The fetus is reassuring.   Patient Active Problem List   Diagnosis Date Noted   Indication for care in labor and delivery, antepartum 01/21/2021   Echogenic intracardiac focus of fetus on prenatal ultrasound 09/22/2020   Pregnancy headache in second trimester 08/14/2020   Hx of anxiety disorder 08/14/2020    Plan  1. Admit to L&D :   2. EFM: -- Category 1 3. Stadol or Epidural if desired.   4. Admission labs  5. Expect vaginal delivery  Elonda Husky, M.D. 01/21/2021 9:51 AM

## 2021-01-22 LAB — RPR: RPR Ser Ql: NONREACTIVE

## 2021-01-22 NOTE — Progress Notes (Signed)
Patient discharged with infant. Discharge instructions, prescriptions, and follow up appointments given to and reviewed with patient. Patient verbalized understanding. Will be escorted out by axillary.  °

## 2021-01-22 NOTE — Discharge Summary (Signed)
Patient Name: Deborah Lester DOB: 1990/04/11 MRN: [id number]                            Discharge Summary  Date of Admission: 01/21/2021 Date of Discharge: 01/22/2021 Delivering Provider: Linzie Collin   Admitting Diagnosis: Indication for care in labor and delivery, antepartum [O75.9] at [redacted]w[redacted]d Secondary diagnosis:  Active Problems:   Indication for care in labor and delivery, antepartum   SROM - Labor  Mode of Delivery: normal spontaneous vaginal delivery              Discharge diagnosis: Term Pregnancy Delivered      Intrapartum Procedures: epidural                        Discharge Day SOAP Note:  Progress Note - Vaginal Delivery  Deborah Lester is a 30 y.o. G2P2002 now PP day 1 s/p Vaginal, Spontaneous . Delivery was uncomplicated  Subjective  The patient has the following complaints: has no unusual complaints  Pain is controlled with current medications.   Patient is urinating without difficulty.  She is ambulating well.   Desires discharge  Objective  Vital signs: BP 109/73 (BP Location: Left Arm)   Pulse 74   Temp 97.8 F (36.6 C) (Oral)   Resp 16   Ht 5\' 7"  (1.702 m)   Wt 75 kg   LMP 04/22/2020   SpO2 100%   Breastfeeding Unknown   BMI 25.90 kg/m   Physical Exam: Gen: NAD Fundus Fundal Tone: Firm  Lochia Amount: Scant        Data Review Labs: Lab Results  Component Value Date   WBC 13.3 (H) 01/21/2021   HGB 12.0 01/21/2021   HCT 34.5 (L) 01/21/2021   MCV 86.7 01/21/2021   PLT 349 01/21/2021   CBC Latest Ref Rng & Units 01/21/2021 11/13/2020 07/19/2020  WBC 4.0 - 10.5 K/uL 13.3(H) 9.3 11.4(H)  Hemoglobin 12.0 - 15.0 g/dL 07/21/2020 10.9(L) 12.9  Hematocrit 36.0 - 46.0 % 34.5(L) 32.6(L) 38.2  Platelets 150 - 400 K/uL 349 307 340   AB POS  Edinburgh Score: Edinburgh Postnatal Depression Scale Screening Tool 01/21/2021  I have been able to laugh and see the funny side of things. 0  I have looked forward with enjoyment to things. 0   I have blamed myself unnecessarily when things went wrong. 0  I have been anxious or worried for no good reason. 0  I have felt scared or panicky for no good reason. 0  Things have been getting on top of me. 2  I have been so unhappy that I have had difficulty sleeping. 0  I have felt sad or miserable. 0  I have been so unhappy that I have been crying. 0  The thought of harming myself has occurred to me. 0  Edinburgh Postnatal Depression Scale Total 2    Assessment/Plan  Active Problems:   Indication for care in labor and delivery, antepartum    Plan for discharge today.  Discharge Instructions: Per After Visit Summary. Activity: Advance as tolerated. Pelvic rest for 6 weeks.  Also refer to After Visit Summary Diet: Regular Medications: Allergies as of 01/22/2021   No Known Allergies      Medication List     STOP taking these medications    Doxylamine-Pyridoxine 10-10 MG Tbec   terconazole 0.4 % vaginal cream Commonly known as: TERAZOL  7       TAKE these medications    Fusion Plus Caps Take 1 tablet by mouth daily.   prenatal multivitamin Tabs tablet Take 1 tablet by mouth daily at 12 noon.   sertraline 25 MG tablet Commonly known as: ZOLOFT TAKE 1 TABLET (25 MG TOTAL) BY MOUTH DAILY.       Outpatient follow up:   Follow-up Information     Doreene Burke, CNM Follow up in 2 week(s).   Specialties: Certified Nurse Midwife, Radiology Why: Possible Video visit Contact information: [address] [place] [address] [phone]                Postpartum contraception: Will discuss at first office visit post-partum  Discharged Condition: good  Discharged to: home  Newborn Data: Disposition:home with mother   FEMALE  Apgars: APGAR (1 MIN): 9   APGAR (5 MINS): 9   APGAR (10 MINS):    Baby Feeding: Breast    Elonda Husky, M.D. 01/22/2021 10:32 AM

## 2021-01-24 ENCOUNTER — Encounter: Payer: BC Managed Care – PPO | Admitting: Obstetrics and Gynecology

## 2021-01-31 ENCOUNTER — Encounter: Payer: BC Managed Care – PPO | Admitting: Certified Nurse Midwife

## 2021-02-06 ENCOUNTER — Telehealth (INDEPENDENT_AMBULATORY_CARE_PROVIDER_SITE_OTHER): Payer: BC Managed Care – PPO | Admitting: Certified Nurse Midwife

## 2021-02-06 ENCOUNTER — Encounter: Payer: Self-pay | Admitting: Certified Nurse Midwife

## 2021-02-06 DIAGNOSIS — Z1331 Encounter for screening for depression: Secondary | ICD-10-CM

## 2021-02-06 NOTE — Progress Notes (Signed)
Virtual Visit via Video Note  I connected with Deborah Lester on 02/06/21 at 11:30 AM EST by a video enabled telemedicine application and verified that I am speaking with the correct person using two identifiers.  Location: Patient: at home Provider: at the office    I discussed the limitations of evaluation and management by telemedicine and the availability of in person appointments. The patient expressed understanding and agreed to proceed.  History of Present Illness: 30 yr old G 2P2 2 wks post partum SVD    Observations/Objective: Has baby blues, has had in the past. States she sometimes does not want to get up due to fatigue. She has FOB for support , he does help with feeding pumped milk at night to allow her for a little extra sleep.   Stopped her zoloft because she was unsure if she was supposed to continue it postpartum.   GAD 7 : Generalized Anxiety Score 01/17/2021 12/22/2020 10/09/2020 08/14/2020  Nervous, Anxious, on Edge 0 1 1 2   Control/stop worrying 0 0 0 1  Worry too much - different things 0 0 0 1  Trouble relaxing 1 0 0 1  Restless 0 0 0 0  Easily annoyed or irritable 0 1 1 1   Afraid - awful might happen 0 0 0 0  Total GAD 7 Score 1 2 2 6   Anxiety Difficulty Not difficult at all Not difficult at all Not difficult at all Somewhat difficult   Flowsheet Row Routine Prenatal from 12/22/2020 in [facility]  PHQ-9 Total Score 3         Bleeding has slowed, pain is minimal. Baby is nursing well.   Assessment and Plan: Pt encourage to start back on her zoloft daily . To reach out if she feels like baby blues is worsening. Discussed potential increasing dose to 50 mg daily if she is not noticing a difference one she has been back on it for a few weeks.   Follow Up Instructions: PRN or as scheduled for 4 wk PPV in office.    I discussed the assessment and treatment plan with the patient. The patient was provided an opportunity to ask questions and all were  answered. The patient agreed with the plan and demonstrated an understanding of the instructions.   The patient was advised to call back or seek an in-person evaluation if the symptoms worsen or if the condition fails to improve as anticipated.  I provided 7 minutes of video face-to-face time during this encounter.   , CNM

## 2021-03-05 ENCOUNTER — Other Ambulatory Visit: Payer: Self-pay

## 2021-03-05 ENCOUNTER — Ambulatory Visit (INDEPENDENT_AMBULATORY_CARE_PROVIDER_SITE_OTHER): Payer: BC Managed Care – PPO | Admitting: Certified Nurse Midwife

## 2021-03-05 MED ORDER — NORETHINDRONE 0.35 MG PO TABS: 1.0000 | ORAL_TABLET | Freq: Every day | ORAL | 11 refills | Status: DC

## 2021-03-05 NOTE — Progress Notes (Signed)
Subjective:    Deborah Lester is a 31 y.o. G34P2002 Caucasian female who presents for a postpartum visit. She is 6 weeks postpartum following a spontaneous vaginal delivery at 38.5 gestational weeks. Anesthesia: epidural. I have fully reviewed the prenatal and intrapartum course. Postpartum course has been normal. Baby's course has been normal. Baby is feeding by breast. Bleeding no bleeding. Bowel function is normal. Bladder function is normal. Patient is not sexually active. . Contraception method is oral progesterone-only contraceptive. Postpartum depression screening: negative. Score 5 .  Last pap 09/03/2018 and was negative.  The following portions of the patient's history were reviewed and updated as appropriate: allergies, current medications, past medical history, past surgical history and problem list.  Review of Systems Pertinent items are noted in HPI.   Vitals:   03/05/21 0944  BP: 123/76  Pulse: 76  Weight: 148 lb (67.1 kg)  Height: 5\' 7"  (1.702 m)   No LMP recorded.  Objective:   General:  alert, cooperative and no distress   Breasts:  deferred, no complaints  Lungs: clear to auscultation bilaterally  Heart:  regular rate and rhythm  Abdomen: soft, nontender   Vulva: normal  Vagina: normal vagina  Cervix:  closed  Corpus: Well-involuted  Adnexa:  Non-palpable  Rectal Exam: no hemorrhoids        Assessment:   Postpartum exam 6 wks s/p SVD Breast feeding Depression screening Contraception counseling   Plan:  : oral progesterone-only contraceptive Follow up in: 8 months for annual or earlier if needed  , CNM

## 2021-03-05 NOTE — Patient Instructions (Signed)

## 2021-04-17 ENCOUNTER — Encounter: Payer: Self-pay | Admitting: Certified Nurse Midwife

## 2021-07-04 ENCOUNTER — Encounter: Payer: Self-pay | Admitting: Certified Nurse Midwife

## 2021-11-05 ENCOUNTER — Encounter: Payer: Self-pay | Admitting: Certified Nurse Midwife

## 2022-02-15 ENCOUNTER — Other Ambulatory Visit: Payer: Self-pay

## 2022-02-15 MED ORDER — NORETHINDRONE 0.35 MG PO TABS: 1.0000 | ORAL_TABLET | Freq: Every day | ORAL | 11 refills | Status: DC

## 2022-03-28 ENCOUNTER — Other Ambulatory Visit (HOSPITAL_COMMUNITY)
Admission: RE | Admit: 2022-03-28 | Discharge: 2022-03-28 | Disposition: A | Discharge status: Home / Self Care | Payer: BC Managed Care – PPO | Source: Ambulatory Visit | Attending: Certified Nurse Midwife | Admitting: Certified Nurse Midwife

## 2022-03-28 ENCOUNTER — Encounter: Payer: Self-pay | Admitting: Certified Nurse Midwife

## 2022-03-28 ENCOUNTER — Ambulatory Visit (INDEPENDENT_AMBULATORY_CARE_PROVIDER_SITE_OTHER): Payer: BC Managed Care – PPO | Admitting: Certified Nurse Midwife

## 2022-03-28 VITALS — BP 103/68 | HR 67 | Resp 16 | Wt 144.4 lb

## 2022-03-28 DIAGNOSIS — Z124 Encounter for screening for malignant neoplasm of cervix: Secondary | ICD-10-CM

## 2022-03-28 DIAGNOSIS — Z01419 Encounter for gynecological examination (general) (routine) without abnormal findings: Secondary | ICD-10-CM | POA: Insufficient documentation

## 2022-03-28 MED ORDER — NORETHINDRONE 0.35 MG PO TABS: 1.0000 | ORAL_TABLET | Freq: Every day | ORAL | 11 refills | Status: DC

## 2022-03-28 NOTE — Patient Instructions (Signed)

## 2022-03-28 NOTE — Progress Notes (Signed)
GYNECOLOGY ANNUAL PREVENTATIVE CARE ENCOUNTER NOTE  History:     Deborah Lester is a 31 y.o. G25P2002 female here for a routine annual gynecologic exam.  Current complaints: none.   Denies abnormal vaginal bleeding, discharge, pelvic pain, problems with intercourse or other gynecologic concerns.     Social Relationship: married  Living: spouse and children Work: [employer] Exercise: 3 x wk  Smoke/Alcohol/drug [id number] alcohol use   Gynecologic History Patient's last menstrual period was 03/13/2022 (exact date). Contraception: oral progesterone-only contraceptive, considering BTL Last Pap: 09/03/2018. Results were: normal  Last mammogram: n/a . Results were: normal   Upstream - 03/28/22 0916       Pregnancy Intention Screening   Does the patient want to become pregnant in the next year? No    Does the patient's partner want to become pregnant in the next year? No    Would the patient like to discuss contraceptive options today? No      Contraception Wrap Up   Current Method Oral Contraceptive    End Method Oral Contraceptive    Contraception Counseling Provided No            The pregnancy intention screening data noted above was reviewed. Potential methods of contraception were discussed. The patient elected to proceed with Oral Contraceptive.  Obstetric History OB History  Gravida Para Term Preterm AB Living  2 2 2     2   SAB IAB Ectopic Multiple Live Births        0 2    # Outcome Date GA Lbr Len/2nd Weight Sex Delivery Anes PTL Lv  2 Term 01/21/21 [redacted]w[redacted]d 05:58 / 00:19 7 lb 1.2 oz (3.21 kg) M Vag-Spont EPI  LIV  1 Term 05/08/17   6 lb 11 oz (3.034 kg) F Vag-Spont  N LIV    Past Medical History:  Diagnosis Date   Anemia affecting pregnancy    Anxiety    Depression     Past Surgical History:  Procedure Laterality Date   BREAST MASS EXCISION  2015   FOOT SURGERY  2014   WISDOM TOOTH EXTRACTION  2011    Current Outpatient Medications on File Prior  to Visit  Medication Sig Dispense Refill   norethindrone (MICRONOR) 0.35 MG tablet Take 1 tablet (0.35 mg total) by mouth daily. 30 tablet 11   sertraline (ZOLOFT) 25 MG tablet TAKE 1 TABLET (25 MG TOTAL) BY MOUTH DAILY. 90 tablet 1   Iron-FA-B Cmp-C-Biot-Probiotic (FUSION PLUS) CAPS Take 1 tablet by mouth daily. (Patient not taking: Reported on 03/28/2022) 30 capsule 10   Prenatal Vit-Fe Fumarate-FA (PRENATAL MULTIVITAMIN) TABS tablet Take 1 tablet by mouth daily at 12 noon.     No current facility-administered medications on file prior to visit.    No Known Allergies  Social History:  reports that she has never smoked. She has never used smokeless tobacco. She reports that she does not currently use alcohol. She reports that she does not currently use drugs.  Family History  Problem Relation Age of Onset   Diabetes Father    Hypertension Maternal Grandfather    Diabetes Maternal Grandfather    COPD Maternal Grandfather    Diabetes Paternal Grandmother    Hypertension Paternal Grandmother    Diabetes Paternal Grandfather    Hypertension Paternal Grandfather     The following portions of the patient's history were reviewed and updated as appropriate: allergies, current medications, past family history, past medical history, past social history, past  surgical history and problem list.  Review of Systems Pertinent items noted in HPI and remainder of comprehensive ROS otherwise negative.  Physical Exam:  BP 103/68   Pulse 67   Resp 16   Wt 144 lb 6.4 oz (65.5 kg)   LMP 03/13/2022 (Exact Date)   Breastfeeding No   BMI 22.62 kg/m  CONSTITUTIONAL: Well-developed, well-nourished female in no acute distress.  HENT:  Normocephalic, atraumatic, External right and left ear normal. Oropharynx is clear and moist EYES: Conjunctivae and EOM are normal. Pupils are equal, round, and reactive to light. No scleral icterus.  NECK: Normal range of motion, supple, no masses.  Normal thyroid.   SKIN: Skin is warm and dry. No rash noted. Not diaphoretic. No erythema. No pallor. MUSCULOSKELETAL: Normal range of motion. No tenderness.  No cyanosis, clubbing, or edema.  2+ distal pulses. NEUROLOGIC: Alert and oriented to person, place, and time. Normal reflexes, muscle tone coordination.  PSYCHIATRIC: Normal mood and affect. Normal behavior. Normal judgment and thought content. CARDIOVASCULAR: Normal heart rate noted, regular rhythm RESPIRATORY: Clear to auscultation bilaterally. Effort and breath sounds normal, no problems with respiration noted. BREASTS: Symmetric in size. No masses, tenderness, skin changes, nipple drainage, or lymphadenopathy bilaterally.  ABDOMEN: Soft, no distention noted.  No tenderness, rebound or guarding.  PELVIC: Normal appearing external genitalia and urethral meatus; normal appearing vaginal mucosa and cervix.  No abnormal discharge noted.pt having some spotting from her period.   Pap smear obtained.  Normal uterine size, no other palpable masses, no uterine or adnexal tenderness.  .   Assessment and Plan:    1. Women's annual routine gynecological examination  Pap: Will follow up results of pap smear and manage accordingly. Mammogram : n/a  Labs: none Refills: POP Referral: none  Routine preventative health maintenance measures emphasized. Please refer to After Visit Summary for other counseling recommendations.      Doreene Burke, CNM [facility]  [facility],  [facility]

## 2022-04-02 LAB — CYTOLOGY - PAP
Comment: NEGATIVE
Diagnosis: NEGATIVE
High risk HPV: NEGATIVE

## 2022-07-11 DIAGNOSIS — B002 Herpesviral gingivostomatitis and pharyngotonsillitis: Secondary | ICD-10-CM | POA: Insufficient documentation

## 2022-12-31 IMAGING — US US OB < 14 WEEKS - US OB TV
1 series · 14 of 28 positions shown · non-contrast
Comparison: None.

CLINICAL DATA: Evaluate for dating, positive pregnancy test

EXAM:
OBSTETRIC <14 WK US AND TRANSVAGINAL OB US
TECHNIQUE: Both transabdominal and transvaginal ultrasound examinations were
performed for complete evaluation of the gestation as well as the
maternal uterus, adnexal regions, and pelvic cul-de-sac.
Transvaginal technique was performed to assess early pregnancy.

[Series 1: us ob less than 14 weeks with ob transvaginal · 14 of 160 slices shown]
[im 6/160]
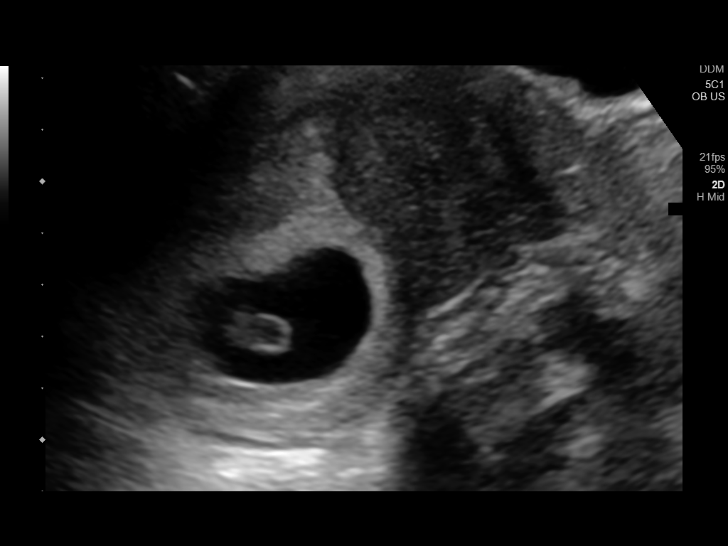
[im 18/160]
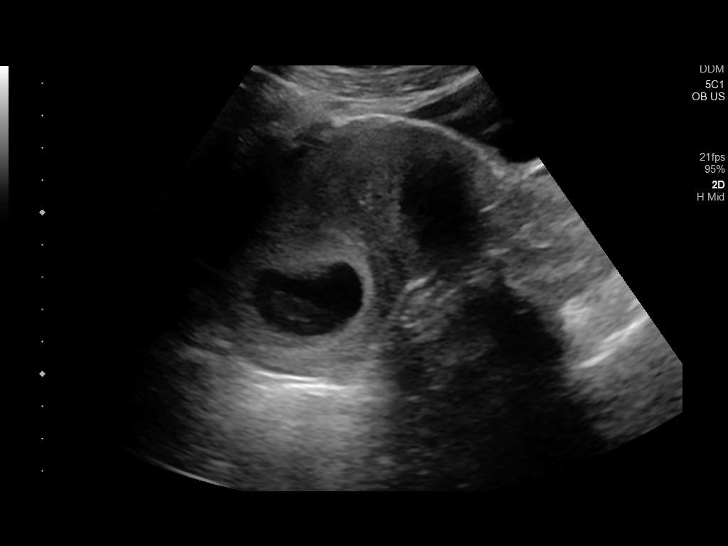
[im 30/160]
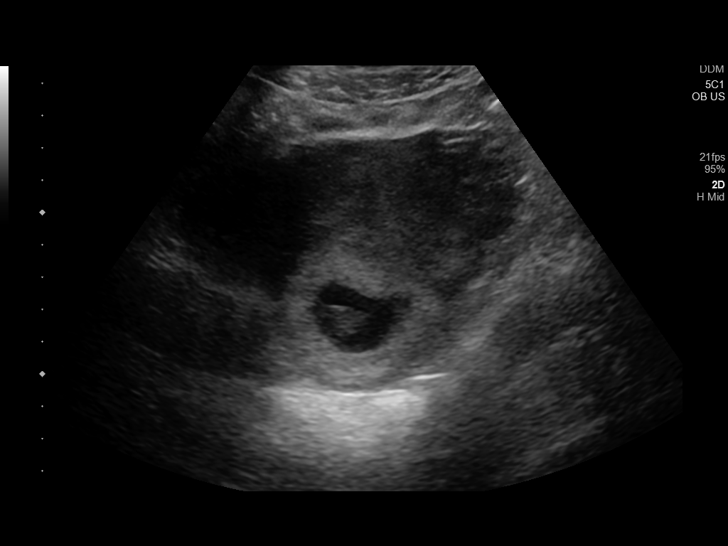
[im 42/160]
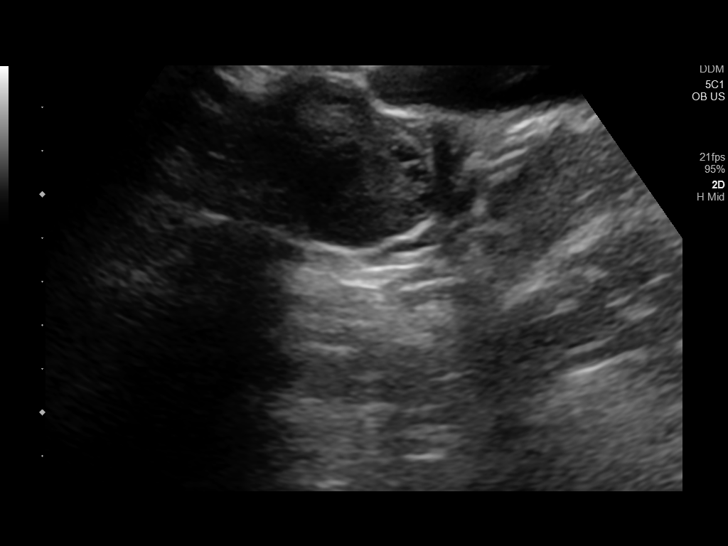
[im 54/160]
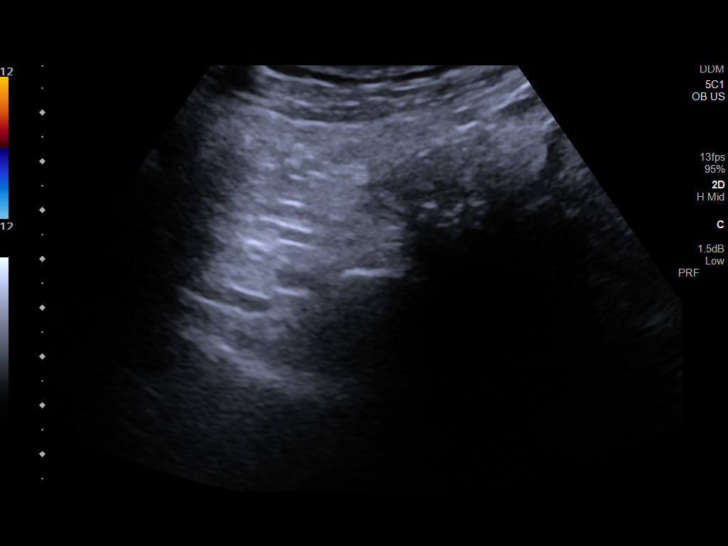
[im 65/160]
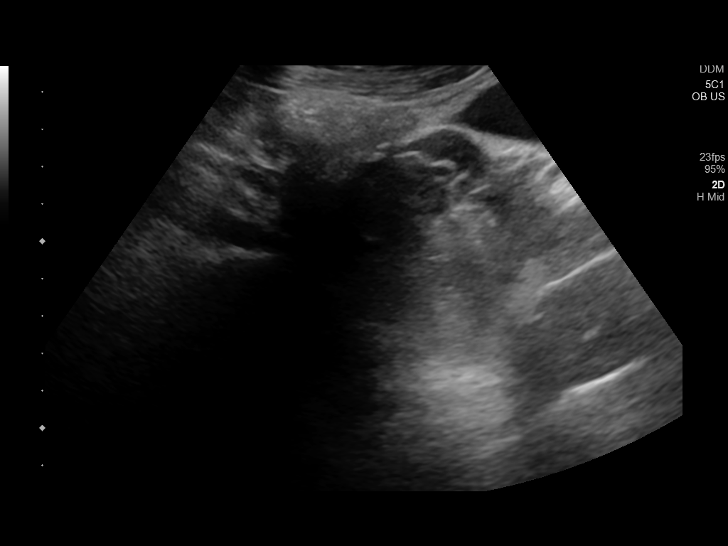
[im 77/160]
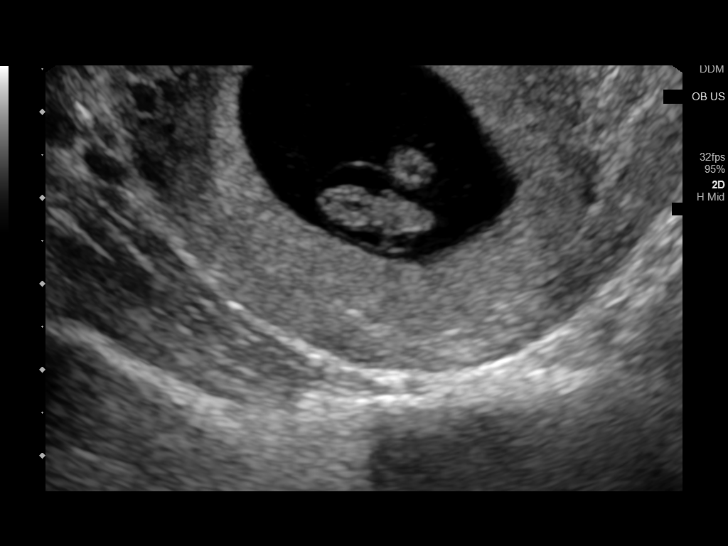
[im 89/160]
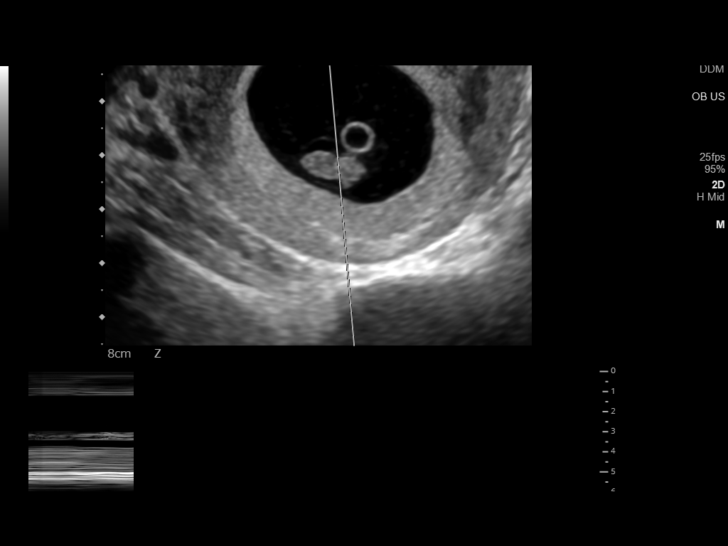
[im 101/160]
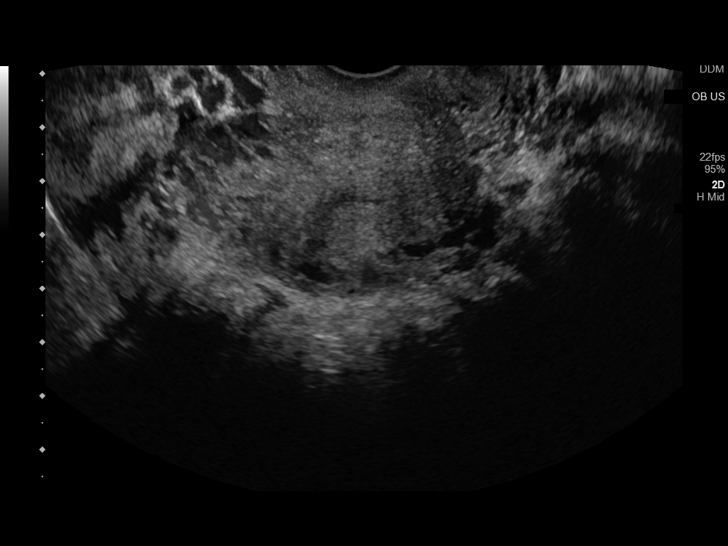
[im 112/160]
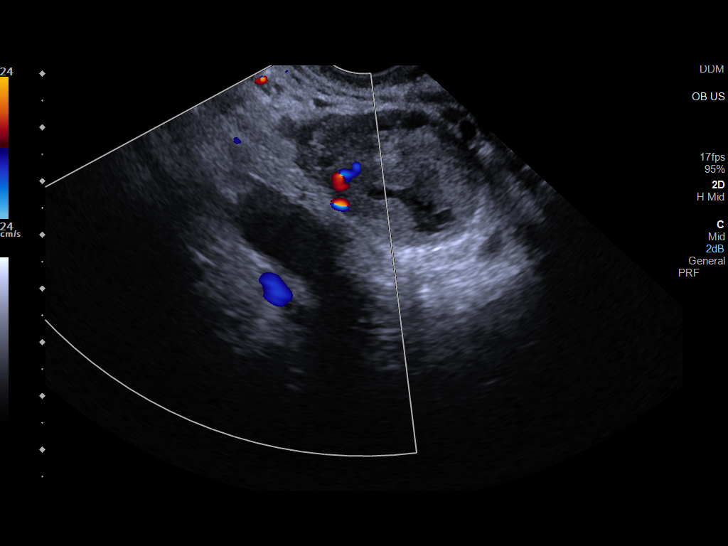
[im 124/160]
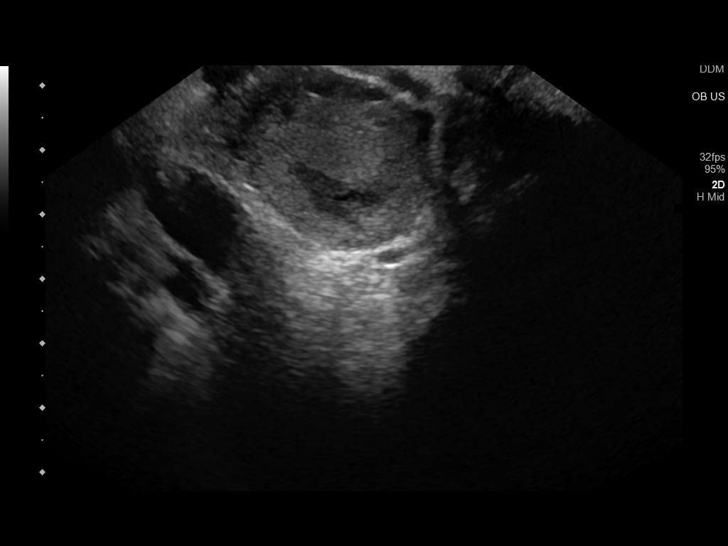
[im 136/160]
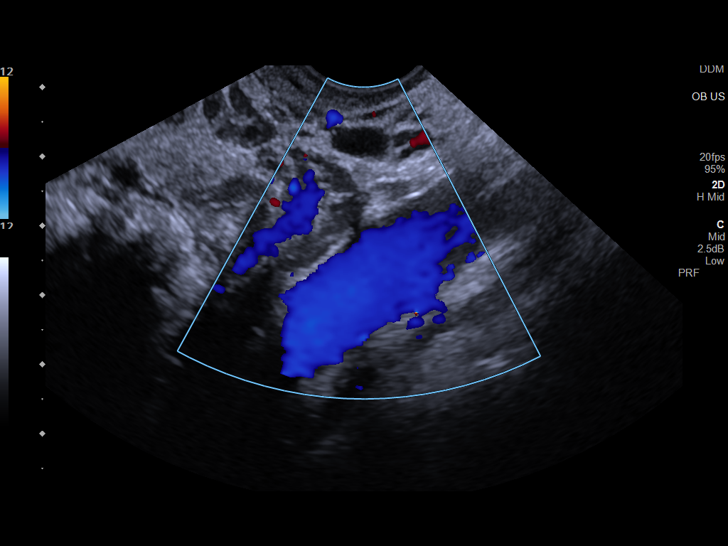
[im 148/160]
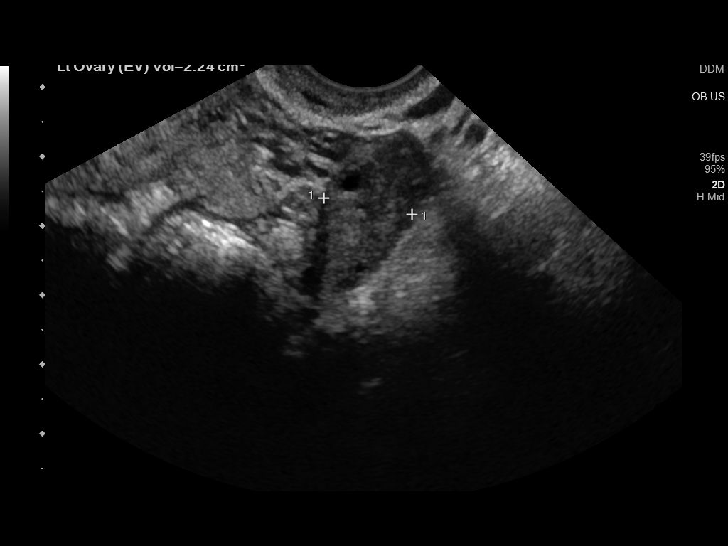
[im 160/160]
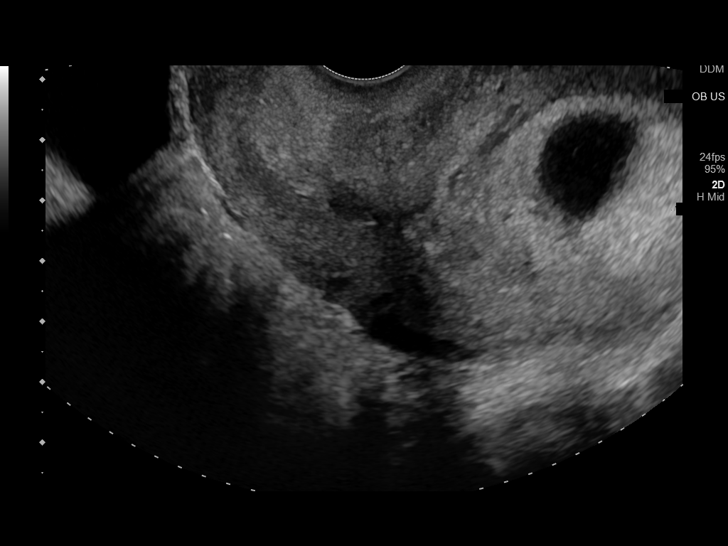

[14 of 28 positions shown; findings below may reference images not displayed]

FINDINGS: Intrauterine gestational sac: Present

Yolk sac:  Present

Embryo:  Present

Cardiac Activity: Present

Heart Rate: 147 bpm

CRL:  14.4 mm   7 w   5 d                  US EDC: 01/31/2021

Subchorionic hemorrhage:  None visualized.

Maternal uterus/adnexae: Within normal limits.
IMPRESSION: Single live intrauterine gestation at 7 weeks 5 days.

## 2023-02-14 ENCOUNTER — Ambulatory Visit: Payer: No Typology Code available for payment source

## 2023-04-13 ENCOUNTER — Other Ambulatory Visit: Payer: Self-pay | Admitting: Certified Nurse Midwife

## 2023-04-23 ENCOUNTER — Ambulatory Visit (INDEPENDENT_AMBULATORY_CARE_PROVIDER_SITE_OTHER): Payer: No Typology Code available for payment source | Admitting: Certified Nurse Midwife

## 2023-04-23 ENCOUNTER — Other Ambulatory Visit (HOSPITAL_COMMUNITY)
Admission: RE | Admit: 2023-04-23 | Discharge: 2023-04-23 | Disposition: A | Discharge status: Home / Self Care | Payer: No Typology Code available for payment source | Source: Ambulatory Visit | Attending: Certified Nurse Midwife | Admitting: Certified Nurse Midwife

## 2023-04-23 ENCOUNTER — Encounter: Payer: Self-pay | Admitting: Certified Nurse Midwife

## 2023-04-23 VITALS — BP 133/77 | HR 61 | Ht 67.0 in | Wt 149.6 lb

## 2023-04-23 DIAGNOSIS — Z01419 Encounter for gynecological examination (general) (routine) without abnormal findings: Secondary | ICD-10-CM | POA: Insufficient documentation

## 2023-04-23 DIAGNOSIS — N898 Other specified noninflammatory disorders of vagina: Secondary | ICD-10-CM

## 2023-04-23 MED ORDER — NORETHIN ACE-ETH ESTRAD-FE 1-20 MG-MCG PO TABS: 1.0000 | ORAL_TABLET | Freq: Every day | ORAL | 3 refills | Status: DC

## 2023-04-23 NOTE — Progress Notes (Signed)
GYNECOLOGY ANNUAL PREVENTATIVE CARE ENCOUNTER NOTE  History:     Deborah Lester is a 33 y.o. G26P2002 female here for a routine annual gynecologic exam.  Current complaints: request to change to [facility], states her bleeding has been irregular and prefers to have a regular period. Vaginal discharge has changed.    Denies abnormal vaginal bleeding, discharge, pelvic pain, problems with intercourse or other gynecologic concerns.     Social Relationship:married Living: spouse and children Work: [employer] Exercise: 3 x week Smoke/Alcohol/drug use: occasional alcohol use   Gynecologic History Patient's last menstrual period was 04/17/2023 (exact date). Contraception: oral progesterone-only contraceptive Last Pap: 03/28/2022. Results were: normal with negative HPV Last mammogram: n/a.    Upstream - 04/23/23 1433       Pregnancy Intention Screening   Does the patient want to become pregnant in the next year? No    Does the patient's partner want to become pregnant in the next year? No    Would the patient like to discuss contraceptive options today? Yes      Contraception Wrap Up   Current Method Oral Contraceptive    End Method Oral Contraceptive    Contraception Counseling Provided Yes            The pregnancy intention screening data noted above was reviewed. Potential methods of contraception were discussed. The patient elected to proceed with Oral Contraceptive.   Obstetric History OB History  Gravida Para Term Preterm AB Living  2 2 2   2   SAB IAB Ectopic Multiple Live Births     0 2    # Outcome Date GA Lbr Len/2nd Weight Sex Type Anes PTL Lv  2 Term 01/21/21 [redacted]w[redacted]d 05:58 / 00:19 7 lb 1.2 oz (3.21 kg) M Vag-Spont EPI  LIV  1 Term 05/08/17   6 lb 11 oz (3.034 kg) F Vag-Spont  N LIV    Past Medical History:  Diagnosis Date   Anemia affecting pregnancy    Anxiety    Depression     Past Surgical History:  Procedure Laterality Date   BREAST MASS EXCISION   2015   FOOT SURGERY  2014   WISDOM TOOTH EXTRACTION  2011    Current Outpatient Medications on File Prior to Visit  Medication Sig Dispense Refill   sertraline (ZOLOFT) 25 MG tablet TAKE 1 TABLET (25 MG TOTAL) BY MOUTH DAILY. 90 tablet 1   valACYclovir (VALTREX) 1000 MG tablet Use two tablets (2,000 mg total) twice daily for one day for flares     No current facility-administered medications on file prior to visit.    No Known Allergies  Social History:  reports that she has never smoked. She has never used smokeless tobacco. She reports that she does not currently use alcohol. She reports that she does not currently use drugs.  Family History  Problem Relation Age of Onset   Diabetes Father    Hypertension Maternal Grandfather    Diabetes Maternal Grandfather    COPD Maternal Grandfather    Diabetes Paternal Grandmother    Hypertension Paternal Grandmother    Diabetes Paternal Grandfather    Hypertension Paternal Grandfather     The following portions of the patient's history were reviewed and updated as appropriate: allergies, current medications, past family history, past medical history, past social history, past surgical history and problem list.  Review of Systems Pertinent items noted in HPI and remainder of comprehensive ROS otherwise negative.  Physical Exam:  BP 133/77   Pulse 61   Ht 5\' 7"  (1.702 m)   Wt 149 lb 9.6 oz (67.9 kg)   LMP 04/17/2023 (Exact Date)   BMI 23.43 kg/m  CONSTITUTIONAL: Well-developed, well-nourished female in no acute distress.  HENT:  Normocephalic, atraumatic, External right and left ear normal. Oropharynx is clear and moist EYES: Conjunctivae and EOM are normal. Pupils are equal, round, and reactive to light. No scleral icterus.  NECK: Normal range of motion, supple, no masses.  Normal thyroid.  SKIN: Skin is warm and dry. No rash noted. Not diaphoretic. No erythema. No pallor. MUSCULOSKELETAL: Normal range of motion. No tenderness.   No cyanosis, clubbing, or edema.  2+ distal pulses. NEUROLOGIC: Alert and oriented to person, place, and time. Normal reflexes, muscle tone coordination.  PSYCHIATRIC: Normal mood and affect. Normal behavior. Normal judgment and thought content. CARDIOVASCULAR: Normal heart rate noted, regular rhythm RESPIRATORY: Clear to auscultation bilaterally. Effort and breath sounds normal, no problems with respiration noted. BREASTS: Symmetric in size. No masses, tenderness, skin changes, nipple drainage, or lymphadenopathy bilaterally.  ABDOMEN: Soft, no distention noted.  No tenderness, rebound or guarding.  PELVIC: Normal appearing external genitalia and urethral meatus; normal appearing vaginal mucosa and cervix.  No abnormal discharge noted.  Pap smear not due.  Normal uterine size, no other palpable masses, no uterine or adnexal tenderness.  .   Assessment and Plan:    1. Encounter for annual routine gynecological examination (Primary)   Pap: not due  Mammogram : n/a  Labs:vaginal swab Refills: ocp Referral: none  Routine preventative health maintenance measures emphasized. Please refer to After Visit Summary for other counseling recommendations.      Doreene Burke, CNM [facility]  [facility],  [facility]

## 2023-04-23 NOTE — Patient Instructions (Signed)

## 2023-04-24 LAB — CERVICOVAGINAL ANCILLARY ONLY
Bacterial Vaginitis (gardnerella): NEGATIVE
Candida Glabrata: NEGATIVE
Candida Vaginitis: NEGATIVE
Comment: NEGATIVE
Comment: NEGATIVE
Comment: NEGATIVE

## 2023-10-02 NOTE — Progress Notes (Signed)
    GYNECOLOGY PROGRESS NOTE  Subjective:  PCP: Patient, No Pcp Per  Patient ID: Deborah Lester, female    DOB: 06-Aug-1990, 33 y.o.   MRN: [id number]  HPI  Patient is a 33 y.o. G44P2002 female who presents for BTL consult. She is currently taking COCs to help with period symptoms and for contraception. These help her symptoms somewhat, but periods and still very long, heavy, and painful. She is debating between BTL or vasectomy and has questions about post-tubal syndrome. Hoping to eventually come off the COCs and be on nothing.   Last pap: 2023, NILM Last annual: Jan w/Annie  The following portions of the patient's history were reviewed and updated as appropriate: allergies, current medications, past family history, past medical history, past social history, past surgical history, and problem list.  Review of Systems Pertinent items are noted in HPI.   Objective:   Blood pressure 115/64, pulse 63, height 5' 7 (1.702 m), weight 151 lb (68.5 kg), last menstrual period 10/03/2023. Body mass index is 23.65 kg/m.  General appearance: alert and cooperative Abdomen: soft, non-tender; bowel sounds normal; no masses,  no organomegaly Pelvic: deferred Extremities: extremities normal, atraumatic, no cyanosis or edema Neurologic: Grossly normal   Assessment/Plan:   1. Encounter for consultation for female sterilization   2. Menorrhagia with regular cycle   3. Dysmenorrhea    33 y.o. [id number] with HMB and dysmenorrhea, currently on COCs, desiring alternative contraception and here to discuss possible BTL. Other reversible forms of contraception were discussed, and recommended she consider Mirena IUD, as that would provide superior contraception as well as address her period symptoms. Risks of BTL discussed with patient including but not limited to: risk of regret, permanence of method, bleeding, infection, injury to surrounding organs and need for additional procedures.  Failure risk of about  1% with increased risk of ectopic gestation if pregnancy occurs was also discussed with patient.  Also discussed possibility of post-tubal pain syndrome.  -Pt will consider options and discuss with husband -Notify clinic of decision; if chooses IUD, aware she can request pre-op pain meds and/or paracervical block.  -If chooses BTL, will need to schedule a pre-op visit w/Dr. Janit.     Estil Mangle, DO [facility]

## 2023-10-07 ENCOUNTER — Encounter: Payer: Self-pay | Admitting: Obstetrics

## 2023-10-07 ENCOUNTER — Ambulatory Visit: Admitting: Obstetrics

## 2023-10-07 VITALS — BP 115/64 | HR 63 | Ht 67.0 in | Wt 151.0 lb

## 2023-10-07 DIAGNOSIS — N920 Excessive and frequent menstruation with regular cycle: Secondary | ICD-10-CM | POA: Diagnosis not present

## 2023-10-07 DIAGNOSIS — N946 Dysmenorrhea, unspecified: Secondary | ICD-10-CM | POA: Diagnosis not present

## 2023-10-07 DIAGNOSIS — Z3009 Encounter for other general counseling and advice on contraception: Secondary | ICD-10-CM | POA: Diagnosis not present

## 2023-10-20 ENCOUNTER — Other Ambulatory Visit: Payer: Self-pay

## 2023-10-20 MED ORDER — NORETHIN ACE-ETH ESTRAD-FE 1-20 MG-MCG PO TABS: 1.0000 | ORAL_TABLET | Freq: Every day | ORAL | 0 refills | Status: AC

## 2023-11-05 NOTE — Progress Notes (Signed)
    GYNECOLOGY OFFICE PROCEDURE NOTE  SUBJECTIVE Deborah Lester is a 33 y.o. [id number] here for Mirena  IUD insertion. She desires IUD for contraception and dysmenorrhea. Is currently taking OCP. No GYN concerns.  Last pap smear was on 03/28/22 and was normal.  OBJECTIVE BP 120/69   Pulse 67   Ht 5' 7 (1.702 m)   Wt 149 lb 4.8 oz (67.7 kg)   LMP 10/31/2023 (Exact Date)   BMI 23.38 kg/m   PHYSICAL EXAM GEN: A&O, NAD RESP: Normal work of breathing PELVIC: external genitalia normal; vagina without discharge or lesions; cervix non-tender and normal appearing EXT: No edema NEURO: No focal deficit  IUD Insertion Procedure Note Patient identified, informed consent performed, consent signed.    Urine pregnancy test negative. Discussed risks of irregular bleeding, cramping, infection, malpositioning or misplacement of the IUD outside the uterus which may require further procedure such as laparoscopy. Also discussed >99% contraception efficacy, increased risk of ectopic pregnancy with failure of method.   Emphasized that this did not protect against STIs, condoms recommended during all sexual encounters. Time out was performed.   Speculum placed in the vagina.  Cervix visualized.  Cleaned with Betadine x 2.  Grasped anteriorly with a single tooth tenaculum.  Uterus unable to be sounded, unable to pass the internal os with significant pressure. Procedure was discontinued for safety and to avoid perforation.  ASSESSMENT/PLAN 33 y.o. [id number] s/p IUD insertion, unsuccessful attempt today due to internal os unable to pass.  -Continue OCPs for now -Rx for Misoprostol  pre-procedurally. -Will work pt in asap for re-insertion attempt, is leaving town 8/20 and desiring prior to    Estil Mangle, DO [facility]

## 2023-11-05 NOTE — Patient Instructions (Signed)
 Intrauterine Device (IUD) Insertion: What to Expect  An intrauterine device (IUD) is put in (inserted) your uterus to prevent pregnancy. It's a small, T-shaped device that has one or two nylon strings hanging down from it. The strings hang out of your cervix, which is the lowest part of your uterus. Tell a health care provider about: Any allergies you have. All medicines you take. These include vitamins, herbs, eye drops, and creams. Any surgeries you have had. Any medical problems you have. Whether you're pregnant or may be pregnant. What are the risks? Your health care provider will talk with you about risks. These may include: Infection. Bleeding. Allergic reactions to medicines. A cut to the uterus, also called perforation, or damage to other structures or organs. Accidental placement of the IUD either in the muscle layer of the uterus or outside the uterus. The IUD falling out of the uterus. This is more common if you recently had a baby. Higher risk of ectopic pregnancy. This is when an egg is fertilized outside your uterus. This is rare. Pelvic inflammatory disease (PID). This is an infection in the uterus and fallopian tubes. The IUD doesn't cause the infection. The infection is usually from a sexually transmitted infection (STI). If this happens, it is usually during the first 20 days after the IUD is put in. This is rare. What happens before? Ask about changing or stopping: Any medicines you take. Any vitamins, herbs, or supplements you take. Your provider may tell you to take pain medicines you can buy at the store before the procedure. You may have tests for: Pregnancy. You may have a pee (urine) or blood sample taken. STIs. Placing an IUD can make an infection worse. To check for cervical cancer. You may have a Pap test, which is when cells from your cervix are removed for testing. What happens during an IUD insertion? A tool, called a speculum, will be placed in your  vagina and widened so that your provider can see your cervix. A medicine to clean your cervix may be used to help lower your risk of infection. You may be given medicine to numb your cervix. This medicine is usually given by an injection into your cervix. A tool will be put into your uterus to check the length of your uterus. A thin tube that holds the IUD will be put into your vagina, through the opening of your cervix, and into your uterus. The IUD will be placed in your uterus. The tube that holds the IUD will be removed. The strings that are attached to the IUD will be trimmed so that they sit just outside your cervix. The speculum will be removed. These steps may vary. Ask what you can expect. What happens after? You may have: Bleeding. It can vary from light bleeding or spotting for a few days to period-like bleeding. This is normal. Cramps and pain in your belly. Dizziness or light-headedness. Pain in your lower back. Headaches and the feeling like you may throw up Follow these instructions at home: Do not have sex or put anything into your vagina for 24 hours after the IUD is placed. Before having sex, check to make sure that you can feel the IUD string or strings. You should be able to feel the end of the string below the opening of your cervix. If your IUD string is in place, you may continue with sex. If you had a hormonal IUD put in more than 7 days after your most recent period  started, you will need to use a backup method of birth control for 7 days after the IUD was placed. Hormones are chemicals that affect how the body works. Check that the IUD is still in place by feeling for the strings after every period, or check once a month. Use a condom every time you have sex to prevent STIs. An IUD won't protect you from STIs. Take your medicines only as told. Contact a health care provider if: You have any of the following problems with your IUD string or strings: The string  bothers or hurts you or your sexual partner. You can't feel the string. The string has gotten longer. The IUD comes out or you can feel the IUD in your vagina. You think you may be pregnant, or you miss your period. You think you may have an STI. You have bad-smelling discharge from your vagina. You have a fever and chills. You have pain during sex. Get help right away if: You have heavy bleeding, which means soaking more than 2 pads per hour for 2 hours in a row. You have sudden, really bad belly pain. This information is not intended to replace advice given to you by your health care provider. Make sure you discuss any questions you have with your health care provider. Document Revised: 11/25/2022 Document Reviewed: 11/25/2022 Elsevier Patient Education  2024 [employer].

## 2023-11-13 ENCOUNTER — Encounter: Payer: Self-pay | Admitting: Obstetrics

## 2023-11-13 ENCOUNTER — Ambulatory Visit: Admitting: Obstetrics

## 2023-11-13 VITALS — BP 120/69 | HR 67 | Ht 67.0 in | Wt 149.3 lb

## 2023-11-13 DIAGNOSIS — Z30430 Encounter for insertion of intrauterine contraceptive device: Secondary | ICD-10-CM | POA: Diagnosis not present

## 2023-11-13 DIAGNOSIS — Z538 Procedure and treatment not carried out for other reasons: Secondary | ICD-10-CM

## 2023-11-13 DIAGNOSIS — Z3202 Encounter for pregnancy test, result negative: Secondary | ICD-10-CM | POA: Diagnosis not present

## 2023-11-13 DIAGNOSIS — Z01812 Encounter for preprocedural laboratory examination: Secondary | ICD-10-CM

## 2023-11-13 LAB — POCT URINE PREGNANCY: Preg Test, Ur: NEGATIVE

## 2023-11-13 MED ORDER — MISOPROSTOL 200 MCG PO TABS: ORAL_TABLET | ORAL | 0 refills | Status: AC

## 2023-11-17 ENCOUNTER — Encounter: Payer: Self-pay | Admitting: Obstetrics

## 2023-11-17 ENCOUNTER — Ambulatory Visit (INDEPENDENT_AMBULATORY_CARE_PROVIDER_SITE_OTHER): Admitting: Obstetrics

## 2023-11-17 VITALS — BP 116/72 | HR 69 | Wt 149.6 lb

## 2023-11-17 DIAGNOSIS — N854 Malposition of uterus: Secondary | ICD-10-CM | POA: Insufficient documentation

## 2023-11-17 DIAGNOSIS — Z3202 Encounter for pregnancy test, result negative: Secondary | ICD-10-CM | POA: Diagnosis not present

## 2023-11-17 DIAGNOSIS — N946 Dysmenorrhea, unspecified: Secondary | ICD-10-CM

## 2023-11-17 DIAGNOSIS — Z01812 Encounter for preprocedural laboratory examination: Secondary | ICD-10-CM

## 2023-11-17 DIAGNOSIS — N920 Excessive and frequent menstruation with regular cycle: Secondary | ICD-10-CM

## 2023-11-17 DIAGNOSIS — Z30430 Encounter for insertion of intrauterine contraceptive device: Secondary | ICD-10-CM

## 2023-11-17 LAB — POCT URINE PREGNANCY: Preg Test, Ur: NEGATIVE

## 2023-11-17 MED ORDER — LEVONORGESTREL 20 MCG/DAY IU IUD
1.0000 | INTRAUTERINE_SYSTEM | Freq: Once | INTRAUTERINE | Status: AC
  Administered 2023-11-17: 1 via INTRAUTERINE

## 2023-11-17 NOTE — Progress Notes (Signed)
    GYNECOLOGY OFFICE PROCEDURE NOTE  SUBJECTIVE Deborah Lester is a 33 y.o. [id number] here for Mirena  IUD insertion.  She desires IUD for menorrhagia and contraception. Failed attempt to insert on 11/13/23 due to closed internal os.  Patient took prescribed misoprostol  last night and this morning. No GYN concerns.  Last pap smear was on 03/28/22 and was normal. Is currently taking OCPs for contraception.   OBJECTIVE BP 116/72   Pulse 69   Wt 149 lb 9.6 oz (67.9 kg)   LMP 10/31/2023 (Exact Date)   BMI 23.43 kg/m   PHYSICAL EXAM GEN: A&O, NAD RESP: Normal work of breathing PELVIC: external genitalia normal; vagina without discharge or lesions; cervix non-tender and normal appearing EXT: No edema NEURO: No focal deficit  IUD Insertion Procedure Note Patient identified, informed consent performed, consent signed.    Urine pregnancy test negative. Discussed risks of irregular bleeding, cramping, infection, malpositioning or misplacement of the IUD outside the uterus which may require further procedure such as laparoscopy. Also discussed >99% contraception efficacy, increased risk of ectopic pregnancy with failure of method.   Emphasized that this did not protect against STIs, condoms recommended during all sexual encounters. Time out was performed.   Speculum placed in the vagina.  Cervix visualized.  Cleaned with Betadine x 2.  Grasped anteriorly with a single tooth tenaculum.  Uterus retroverted, required metal sound and US  guidance to pass flexure and sounded to 8 cm. Mirena  IUD unable to pass flexure, again required US  guidance to ensure no false tunnel. Removed and replaced tenaculum on posterior os and with tension, straightened flexure and Mirena  IUD placed per manufacturer's recommendations.  Strings trimmed to 3 cm. Tenaculum was removed, good hemostasis noted.  US  visualized IUD at fundus and in correct position. Patient tolerated procedure well.   ASSESSMENT/PLAN 33 y.o. [id number] s/p  IUD insertion, difficult insertion requiring US  guidance,  Mirena  placed without complication.  -Backup method for the next 7 days. -Aftercare instructions reviewed, handout provided. Aware IUD protects against pregnancy only, not STIs.  -Discussed how to perform string checks, if cannot find strings, RTC for exam. -Removal in 5-8 years, sooner if desires pregnancy.  Follow up 4 weeks for IUD string check, sooner prn.   Estil Mangle, DO [facility]

## 2023-12-10 NOTE — Progress Notes (Signed)
    GYNECOLOGY PROGRESS NOTE  Subjective:  PCP: Patient, No Pcp Per  Patient ID: Ophelia Sipe, female    DOB: 1990/06/29, 33 y.o.   MRN: [id number]  HPI  Patient is a 33 y.o. G69P2002 female who presents for IUD check.  Mirena  IUD inserted 11/17/23 for menorrhagia with this provider.  Patient does not check her strings at home.  She reports her most recent period lasted 2 weeks, but was light, only needed a liner.   The following portions of the patient's history were reviewed and updated as appropriate: allergies, current medications, past family history, past medical history, past social history, past surgical history, and problem list.  Review of Systems Pertinent items are noted in HPI.   Objective:   Blood pressure 106/69, pulse 63, weight 148 lb 12.8 oz (67.5 kg), last menstrual period 11/26/2023. Body mass index is 23.31 kg/m.  Physical Exam CONSTITUTIONAL: Well-developed, well-nourished female in no acute distress.  NEUROLOGIC: Alert and oriented to person, place, and time. Normal reflexes, muscle tone coordination.  ABDOMEN: Soft, no distention noted.   PELVIC: Normal appearing external genitalia; normal appearing vaginal mucosa and cervix.  IUD strings visualized, about 3 cm in length outside cervix. Done in the presence of a chaperone.  EXTREMITIES: Non-tender, no edema or cyanosis   Assessment/Plan:   1. IUD check up   Patient to keep IUD in place for up to 8 years; can come in for removal if she desires pregnancy earlier or for any concerning side effects.  Follow up prn.   Estil Mangle, DO [facility]

## 2023-12-16 ENCOUNTER — Ambulatory Visit: Admitting: Obstetrics

## 2023-12-16 ENCOUNTER — Encounter: Payer: Self-pay | Admitting: Obstetrics

## 2023-12-16 VITALS — BP 106/69 | HR 63 | Wt 148.8 lb

## 2023-12-16 DIAGNOSIS — Z30431 Encounter for routine checking of intrauterine contraceptive device: Secondary | ICD-10-CM

## 2024-06-09 ENCOUNTER — Ambulatory Visit: Admitting: Obstetrics
